# Patient Record
Sex: Female | Born: 1965 | Race: White | Hispanic: No | Marital: Married | Smoking: Never smoker
Health system: Southern US, Community
[De-identification: ages and names within clinical notes are randomized; demographics above are authoritative.]

## PROBLEM LIST (undated history)

## (undated) DIAGNOSIS — M1710 Unilateral primary osteoarthritis, unspecified knee: Secondary | ICD-10-CM

## (undated) HISTORY — DX: Unilateral primary osteoarthritis, unspecified knee: M17.10

## (undated) HISTORY — PX: JOINT REPLACEMENT: SHX530

## (undated) HISTORY — PX: WISDOM TOOTH EXTRACTION: SHX21

## (undated) HISTORY — PX: KNEE ARTHROSCOPY: SUR90

## (undated) HISTORY — PX: EYE SURGERY: SHX253

---

## 2009-06-09 LAB — HM MAMMOGRAPHY: HM Mammogram: NORMAL

## 2010-03-10 LAB — CONVERTED CEMR LAB: Pap Smear: NORMAL

## 2010-03-11 ENCOUNTER — Encounter: Payer: Self-pay | Admitting: Family Medicine

## 2010-06-02 ENCOUNTER — Ambulatory Visit: Payer: Self-pay | Admitting: Family Medicine

## 2010-06-02 DIAGNOSIS — M1710 Unilateral primary osteoarthritis, unspecified knee: Secondary | ICD-10-CM

## 2010-06-02 DIAGNOSIS — IMO0002 Reserved for concepts with insufficient information to code with codable children: Secondary | ICD-10-CM

## 2010-06-02 HISTORY — DX: Reserved for concepts with insufficient information to code with codable children: IMO0002

## 2010-06-09 ENCOUNTER — Inpatient Hospital Stay (HOSPITAL_COMMUNITY): Admission: RE | Admit: 2010-06-09 | Discharge: 2010-06-11 | Payer: Self-pay | Admitting: Orthopedic Surgery

## 2010-08-26 NOTE — Assessment & Plan Note (Signed)
Summary: BRAND NEW PT/TO EST/CJR   Vital Signs:  Patient profile:   45 year old female Menstrual status:  regular LMP:     05/18/2010 Height:      63.75 inches Weight:      214 pounds BMI:     37.16 Temp:     98.4 degrees F oral Pulse rate:   72 / minute Pulse rhythm:   regular Resp:     12 per minute BP sitting:   122 / 80  (left arm) Cuff size:   large  Vitals Entered By: Sid Falcon LPN (June 02, 2010 2:54 PM)  Nutrition Counseling: Patient's BMI is greater than 25 and therefore counseled on weight management options. LMP (date): 05/18/2010     Menstrual Status regular Enter LMP: 05/18/2010 Last PAP Result normal   History of Present Illness: Patient seen new to establish care. She has history of osteoarthritis involving both knees with planned left total knee replacement one week from today. She takes Motrin and no other medications. No chronic medical problems. No known drug allergies.  Family history significant for grandparent with type 2 diabetes. Father with osteoarthritis.  Patient is married with 2 children. She is a Comptroller. Nonsmoker. Occasional alcohol use. Enjoys cycling.  Preventive Screening-Counseling & Management  Alcohol-Tobacco     Smoking Status: never  Caffeine-Diet-Exercise     Does Patient Exercise: yes  Past History:  Family History: Last updated: 06/02/2010 Family History of Arthritis Paternal grandmother, diabetes type ll  Social History: Last updated: 06/02/2010 Occupation:  Art gallery manager Married Never Smoked Alcohol use-yes Regular exercise-yes  Risk Factors: Exercise: yes (06/02/2010)  Risk Factors: Smoking Status: never (06/02/2010)  Past Medical History: Arthritis, osteoarthrits knees Chicken pox  Past Surgical History: Left knee scope 2005 Right knee 2008 PMH-FH-SH reviewed for relevance  Family History: Family History of Arthritis Paternal grandmother, diabetes type ll  Social  History: Occupation:  Art gallery manager Married Never Smoked Alcohol use-yes Regular exercise-yes Smoking Status:  never Occupation:  employed Does Patient Exercise:  yes  Review of Systems  The patient denies anorexia, fever, weight loss, chest pain, dyspnea on exertion, peripheral edema, prolonged cough, headaches, hemoptysis, and abdominal pain.    Physical Exam  General:  Well-developed,well-nourished,in no acute distress; alert,appropriate and cooperative throughout examination Head:  Normocephalic and atraumatic without obvious abnormalities. No apparent alopecia or balding. Mouth:  Oral mucosa and oropharynx without lesions or exudates.  Teeth in good repair. Neck:  No deformities, masses, or tenderness noted. Lungs:  Normal respiratory effort, chest expands symmetrically. Lungs are clear to auscultation, no crackles or wheezes. Heart:  Normal rate and regular rhythm. S1 and S2 normal without gallop, murmur, click, rub or other extra sounds. Extremities:  No clubbing, cyanosis, edema, or deformity noted with normal full range of motion of all joints.  mild crepitus with flexion of both knees. Neurologic:  alert & oriented X3 and cranial nerves II-XII intact.   Psych:  normally interactive, good eye contact, not anxious appearing, and not depressed appearing.     Impression & Recommendations:  Problem # 1:  OSTEOARTHRITIS, KNEES, BILATERAL (ICD-715.96)  Her updated medication list for this problem includes:    Motrin Ib 200 Mg Tabs (Ibuprofen) .Marland Kitchen... Three tabs every 8 hours pen pain  Problem # 2:  Preventive Health Care (ICD-V70.0) flu vaccine and Tdap recommended as last tetanus not in several years.  Complete Medication List: 1)  Motrin Ib 200 Mg Tabs (Ibuprofen) .... Three tabs every 8 hours pen  pain  Other Orders: Admin 1st Vaccine (21308) Flu Vaccine 46yrs + (65784) Tdap => 47yrs IM (69629) Admin of Any Addtl Vaccine (52841)  Patient Instructions: 1)  Please schedule a  follow-up appointment as needed .    Orders Added: 1)  Admin 1st Vaccine [90471] 2)  Flu Vaccine 18yrs + [32440] 3)  Tdap => 59yrs IM [90715] 4)  Admin of Any Addtl Vaccine [90472] 5)  New Patient Level II [10272]   Immunizations Administered:  Tetanus Vaccine:    Vaccine Type: Tdap    Site: left deltoid    Mfr: GlaxoSmithKline    Dose: 0.5 ml    Route: IM    Given by: Sid Falcon LPN    Exp. Date: 05/15/2012    Lot #: [id number]    VIS given: 06/13/08 version given June 02, 2010.   Immunizations Administered:  Tetanus Vaccine:    Vaccine Type: Tdap    Site: left deltoid    Mfr: GlaxoSmithKline    Dose: 0.5 ml    Route: IM    Given by: Sid Falcon LPN    Exp. Date: 05/15/2012    Lot #: [id number]    VIS given: 06/13/08 version given June 02, 2010.  Preventive Care Screening  Pap Smear:    Date:  03/10/2010    Results:  normal   Mammogram:    Date:  05/27/2009    Results:  normal   Flu Vaccine Consent Questions     Do you have a history of severe allergic reactions to this vaccine? no    Any prior history of allergic reactions to egg and/or gelatin? no    Do you have a sensitivity to the preservative Thimersol? no    Do you have a past history of Guillan-Barre Syndrome? no    Do you currently have an acute febrile illness? no    Have you ever had a severe reaction to latex? no    Vaccine information given and explained to patient? yes    Are you currently pregnant? no    Lot Number:[id number]   Exp Date:01/24/2011   Site Given  Right Deltoid [email or web address]

## 2010-08-26 NOTE — Letter (Signed)
Summary: Wellness Exam Results  Wellness Exam Results   Imported By: Maryln Gottron 06/09/2010 10:37:17  _____________________________________________________________________  External Attachment:    Type:   Image     Comment:   External Document

## 2010-10-07 LAB — DIFFERENTIAL
Lymphocytes Relative: 23 % (ref 12–46)
Lymphs Abs: 1.8 10*3/uL (ref 0.7–4.0)
Neutrophils Relative %: 68 % (ref 43–77)

## 2010-10-07 LAB — CBC
HCT: 29.7 % — ABNORMAL LOW (ref 36.0–46.0)
HCT: 31.5 % — ABNORMAL LOW (ref 36.0–46.0)
Hemoglobin: 10.5 g/dL — ABNORMAL LOW (ref 12.0–15.0)
MCV: 90.9 fL (ref 78.0–100.0)
Platelets: 115 10*3/uL — ABNORMAL LOW (ref 150–400)
Platelets: 178 10*3/uL (ref 150–400)
RBC: 4.27 MIL/uL (ref 3.87–5.11)
RDW: 12.9 % (ref 11.5–15.5)
RDW: 13.1 % (ref 11.5–15.5)
WBC: 6.8 10*3/uL (ref 4.0–10.5)
WBC: 6.9 10*3/uL (ref 4.0–10.5)
WBC: 7.6 10*3/uL (ref 4.0–10.5)

## 2010-10-07 LAB — URINALYSIS, ROUTINE W REFLEX MICROSCOPIC
Protein, ur: NEGATIVE mg/dL
Urobilinogen, UA: 0.2 mg/dL (ref 0.0–1.0)

## 2010-10-07 LAB — BASIC METABOLIC PANEL
BUN: 6 mg/dL (ref 6–23)
Calcium: 8.7 mg/dL (ref 8.4–10.5)
Chloride: 107 mEq/L (ref 96–112)
Creatinine, Ser: 0.71 mg/dL (ref 0.4–1.2)
GFR calc Af Amer: >60 mL/min (ref >60)
GFR calc non Af Amer: >60 mL/min (ref >60)
Potassium: 3.8 mEq/L (ref 3.5–5.1)
Sodium: 138 mEq/L (ref 135–145)
Sodium: 138 mEq/L (ref 135–145)

## 2010-10-07 LAB — PROTIME-INR
INR: 0.94 (ref 0.00–1.49)
INR: 1.07 (ref 0.00–1.49)
INR: 1.08 (ref 0.00–1.49)
Prothrombin Time: 12.7 seconds (ref 11.6–15.2)
Prothrombin Time: 14.2 seconds (ref 11.6–15.2)

## 2010-10-07 LAB — TYPE AND SCREEN

## 2010-10-07 LAB — URINE MICROSCOPIC-ADD ON

## 2010-10-07 LAB — ABO/RH: ABO/RH(D): A NEG

## 2011-09-10 ENCOUNTER — Ambulatory Visit (INDEPENDENT_AMBULATORY_CARE_PROVIDER_SITE_OTHER): Payer: Self-pay | Admitting: Family Medicine

## 2011-09-10 ENCOUNTER — Encounter: Payer: Self-pay | Admitting: Family Medicine

## 2011-09-10 VITALS — BP 108/80 | Temp 98.6°F | Wt 244.0 lb

## 2011-09-10 DIAGNOSIS — B078 Other viral warts: Secondary | ICD-10-CM

## 2011-09-10 DIAGNOSIS — Z7189 Other specified counseling: Secondary | ICD-10-CM

## 2011-09-10 DIAGNOSIS — Z7184 Encounter for health counseling related to travel: Secondary | ICD-10-CM

## 2011-09-10 MED ORDER — ONDANSETRON HCL 8 MG PO TABS: 8.0000 mg | ORAL_TABLET | Freq: Three times a day (TID) | ORAL | Status: AC | PRN

## 2011-09-10 MED ORDER — ZOLPIDEM TARTRATE 10 MG PO TABS: 10.0000 mg | ORAL_TABLET | Freq: Every evening | ORAL | Status: DC | PRN

## 2011-09-10 MED ORDER — CIPROFLOXACIN HCL 500 MG PO TABS: 500.0000 mg | ORAL_TABLET | Freq: Two times a day (BID) | ORAL | Status: AC

## 2011-09-10 NOTE — Progress Notes (Signed)
  Subjective:    Patient ID: Monica Zuniga, female    DOB: 1966/07/25, 46 y.o.   MRN: [id number]  HPI  Patient seen for the following issues  Common wart left middle finger. She tried home treatment with liquid nitrogen without much success. Requesting retreatment today. Occasionally irritating because of location.  Frequent international travel. Immunizations are up-to-date. Requesting prescription for Cipro for traveler's diarrhea. Also, she is here to discuss possible sedative to help with the jet lag and sleep on the plane. She's taken Benadryl but has hang- over drowsiness.  Past Medical History  Diagnosis Date  . OSTEOARTHRITIS, KNEES, BILATERAL 06/02/2010   Past Surgical History  Procedure Date  . Knee arthroscopy     left knee scope 2005, right 2008    reports that she has never smoked. She does not have any smokeless tobacco history on file. Her alcohol and drug histories not on file. family history includes Arthritis in her other and Diabetes in her paternal grandmother. No Known Allergies    Review of Systems  Constitutional: Negative for fever and chills.  Respiratory: Negative for cough.   Cardiovascular: Negative for chest pain.  Gastrointestinal: Negative for nausea and vomiting.       Objective:   Physical Exam  Constitutional: She appears well-developed and well-nourished.  Cardiovascular: Normal rate and regular rhythm.   Pulmonary/Chest: Effort normal and breath sounds normal. No respiratory distress. She has no wheezes. She has no rales.  Skin:       Left middle finger reveals small common wart which is approximately 2 mm diameter which is between the distal interphalangeal joint and tip of the finger          Assessment & Plan:  #1 common wart left middle finger. Discussed risk and benefits of treatment liquid nitrogen patient consents. Treated without difficulty. Followup in 2-3 weeks if not resolving #2 travel counseling. Discussed measures  to reduce jet lag. Discussed possible use of melatonin.  Ambien 10 mg each bedtime when necessary.   Cipro for as needed use for traveler's diarrhea. Also wrote for Zofran 8 mg tablets one every 8 hours as needed for any nausea or vomiting.  She needs to check and see if typhoid vaccine is up to date

## 2011-09-17 IMAGING — CR DG CHEST 2V
2 series · 2 of 2 positions shown · non-contrast
Comparison: None available.

CLINICAL DATA: Preadmission respiratory film for patient with
degenerative disease of the left knee.

CHEST - 2 VIEW

[view not recorded (1 of 2)]
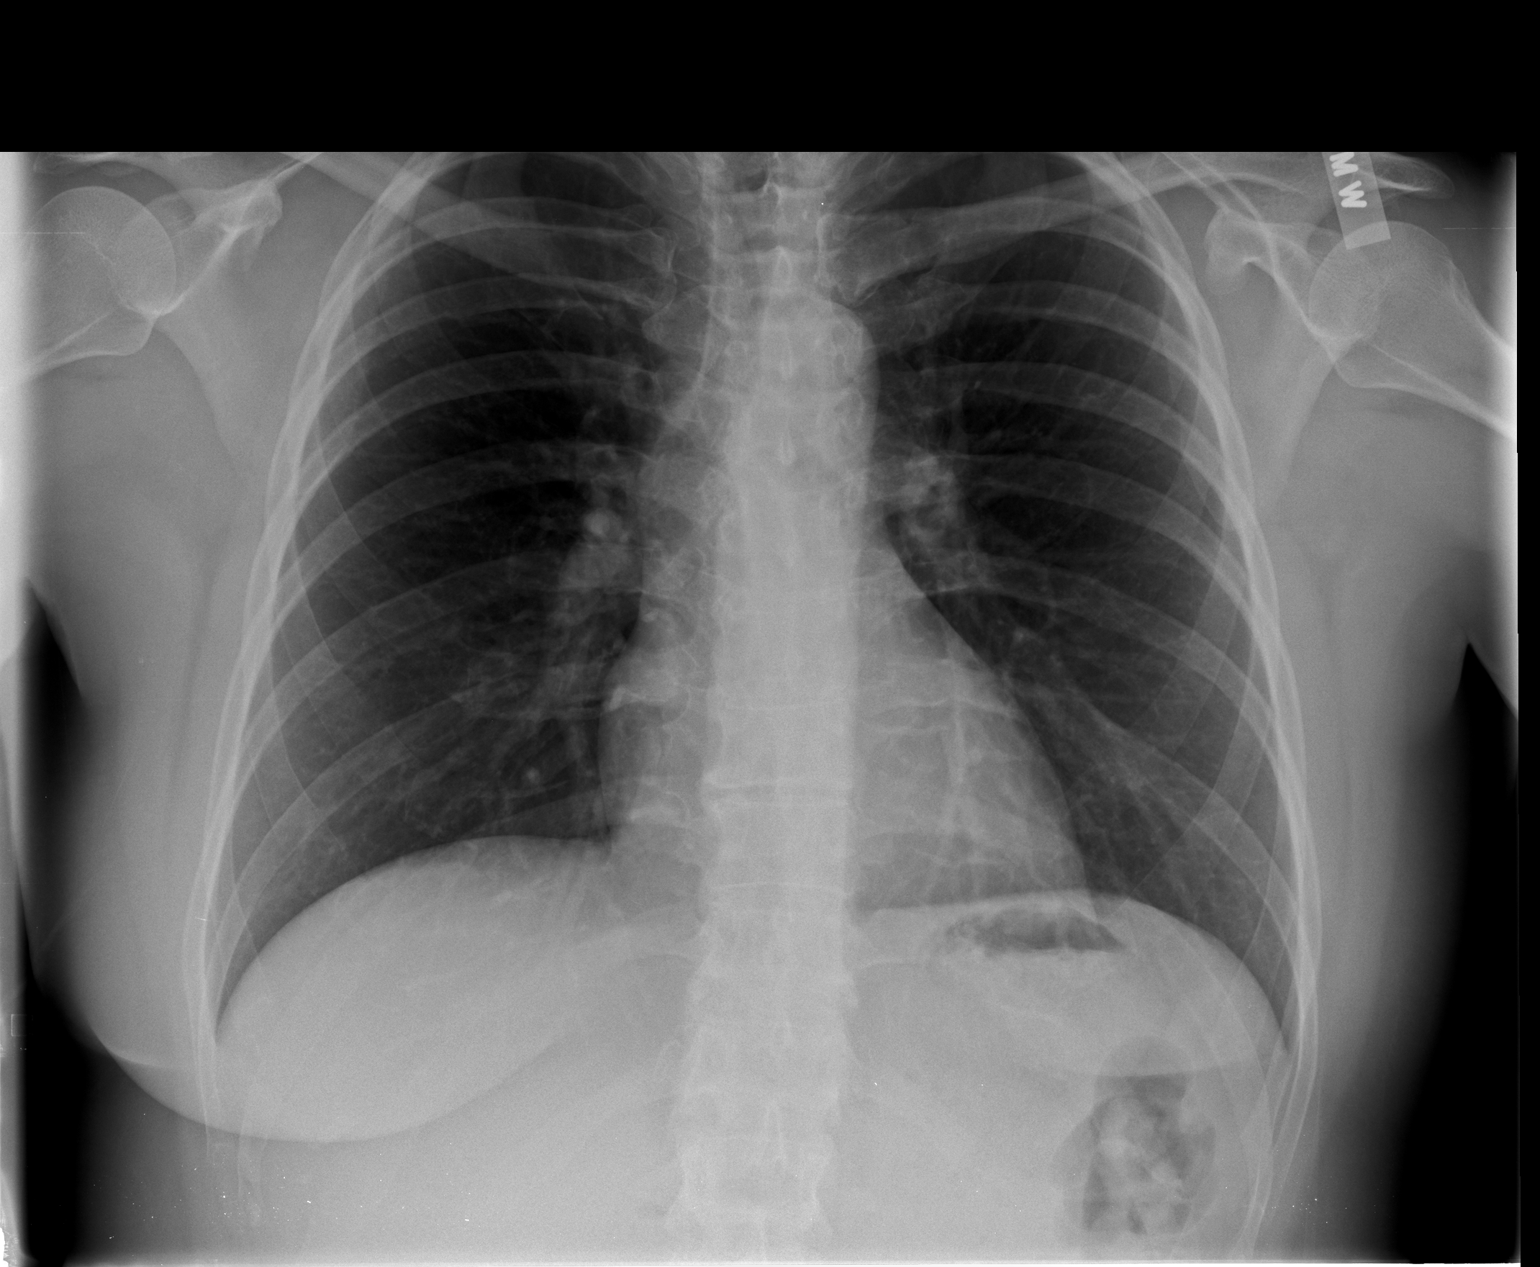

[view not recorded (2 of 2)]
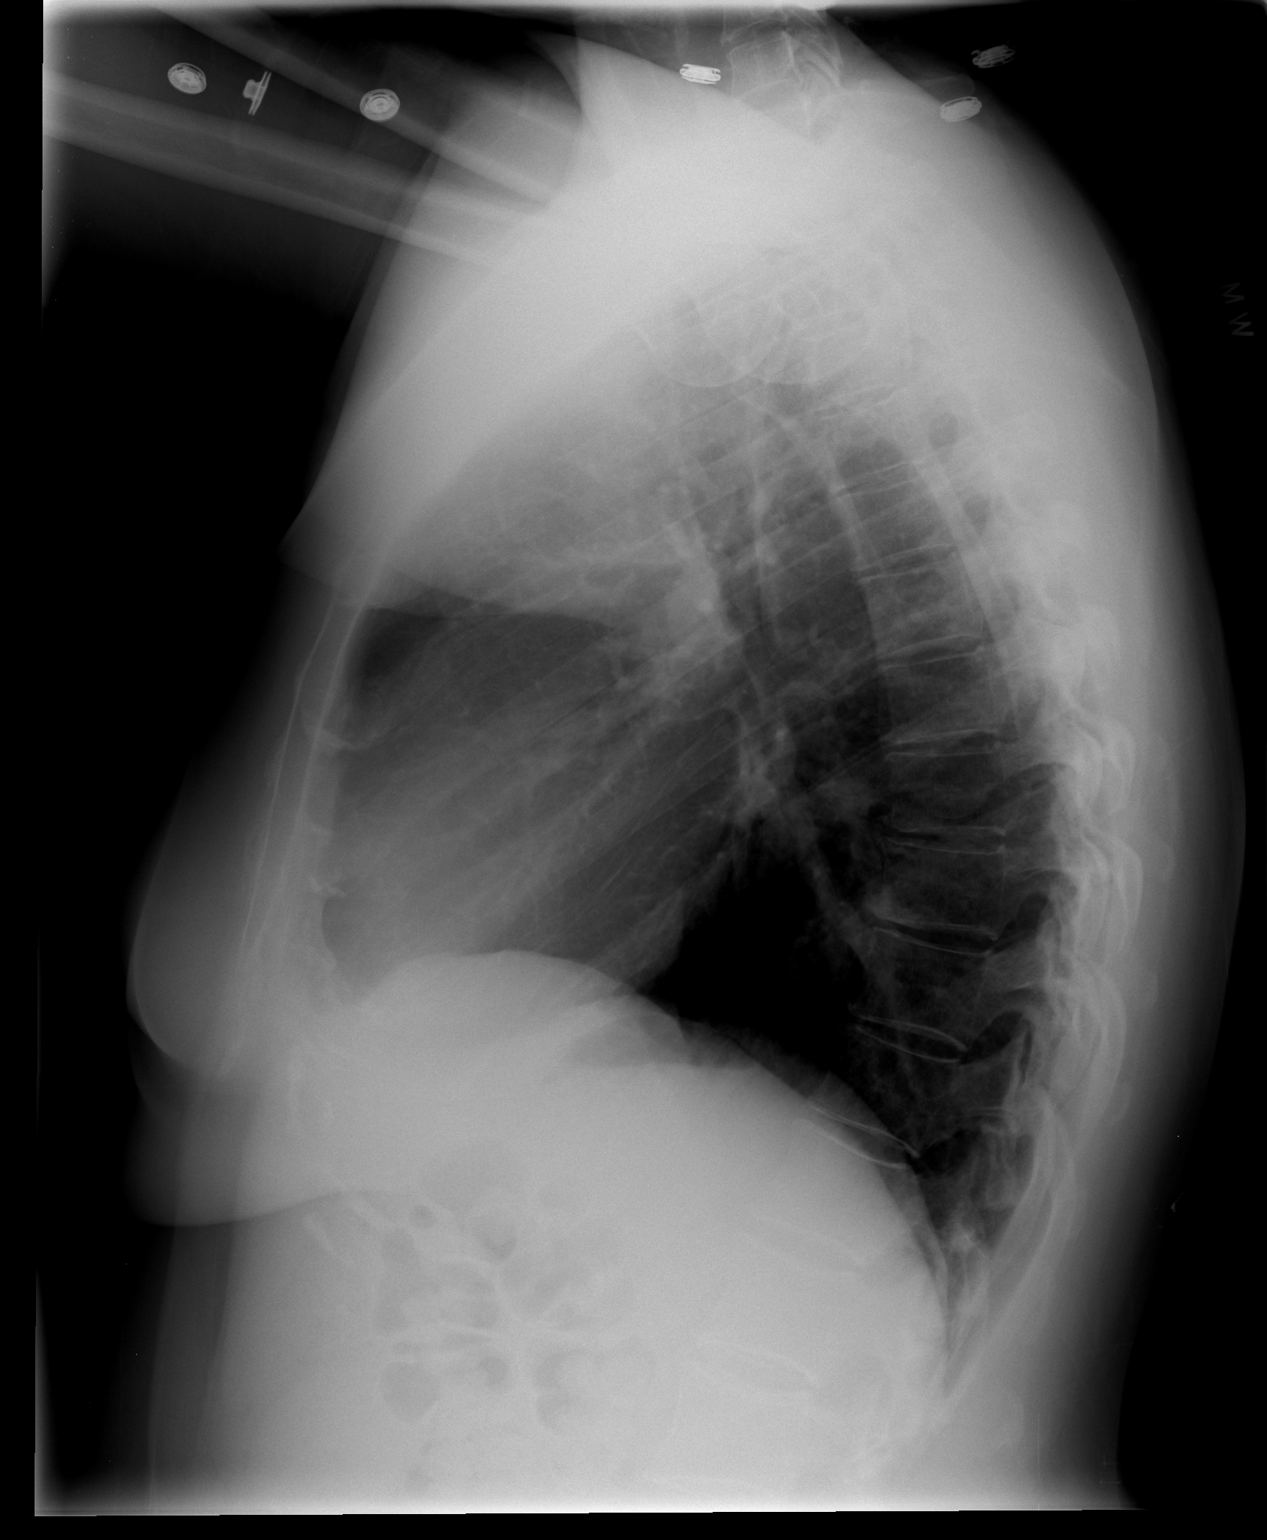

[2 of 2 positions shown; findings below may reference images not displayed]

FINDINGS: Lungs are clear.  No pneumothorax or pleural effusion.
Heart size normal.
IMPRESSION: Negative chest.

## 2012-06-10 ENCOUNTER — Other Ambulatory Visit: Payer: Self-pay | Admitting: Orthopedic Surgery

## 2012-06-13 ENCOUNTER — Encounter (HOSPITAL_COMMUNITY): Payer: Self-pay | Admitting: Pharmacy Technician

## 2012-06-20 ENCOUNTER — Encounter (HOSPITAL_COMMUNITY): Payer: Self-pay

## 2012-06-20 ENCOUNTER — Encounter (HOSPITAL_COMMUNITY)
Admission: RE | Admit: 2012-06-20 | Discharge: 2012-06-20 | Disposition: A | Discharge status: Home / Self Care | Payer: BC Managed Care – HMO | Source: Ambulatory Visit | Attending: Orthopedic Surgery | Admitting: Orthopedic Surgery

## 2012-06-20 LAB — URINALYSIS, ROUTINE W REFLEX MICROSCOPIC
Bilirubin Urine: NEGATIVE
Glucose, UA: NEGATIVE mg/dL
Hgb urine dipstick: NEGATIVE
Ketones, ur: NEGATIVE mg/dL
Protein, ur: NEGATIVE mg/dL
Urobilinogen, UA: 0.2 mg/dL (ref 0.0–1.0)

## 2012-06-20 LAB — BASIC METABOLIC PANEL
BUN: 15 mg/dL (ref 6–23)
CO2: 28 mEq/L (ref 19–32)
Calcium: 10.1 mg/dL (ref 8.4–10.5)
Chloride: 100 mEq/L (ref 96–112)
Creatinine, Ser: 0.66 mg/dL (ref 0.50–1.10)

## 2012-06-20 LAB — CBC WITH DIFFERENTIAL/PLATELET
Basophils Absolute: 0 10*3/uL (ref 0.0–0.1)
Basophils Relative: 0 % (ref 0–1)
Eosinophils Absolute: 0.1 10*3/uL (ref 0.0–0.7)
Eosinophils Relative: 1 % (ref 0–5)
HCT: 41.5 % (ref 36.0–46.0)
Lymphocytes Relative: 31 % (ref 12–46)
MCH: 30 pg (ref 26.0–34.0)
MCHC: 34 g/dL (ref 30.0–36.0)
MCV: 88.3 fL (ref 78.0–100.0)
Monocytes Absolute: 0.7 10*3/uL (ref 0.1–1.0)
Platelets: 232 10*3/uL (ref 150–400)
RDW: 13.3 % (ref 11.5–15.5)
WBC: 9.5 10*3/uL (ref 4.0–10.5)

## 2012-06-20 LAB — URINE MICROSCOPIC-ADD ON

## 2012-06-20 LAB — PROTIME-INR: Prothrombin Time: 12.3 seconds (ref 11.6–15.2)

## 2012-06-20 LAB — SURGICAL PCR SCREEN: MRSA, PCR: NEGATIVE

## 2012-06-20 LAB — APTT: aPTT: 22 seconds — ABNORMAL LOW (ref 24–37)

## 2012-06-20 NOTE — Pre-Procedure Instructions (Signed)
20 Monica Zuniga  06/20/2012   Your procedure is scheduled on:  Monday, December 2nd  Report to [facility] at 0745 AM.  Call this number if you have problems the morning of surgery: [phone]   Remember:   Do not eat food or drink:After Midnight.   Take these medicines the morning of surgery with A SIP OF WATER: tylenol if needed   Do not wear jewelry, make-up or nail polish.  Do not wear lotions, powders, or perfumes. You may wear deodorant.  Do not shave 48 hours prior to surgery. Men may shave face and neck.  Do not bring valuables to the hospital.  Contacts, dentures or bridgework may not be worn into surgery.  Leave suitcase in the car. After surgery it may be brought to your room.  For patients admitted to the hospital, checkout time is 11:00 AM the day of discharge.   Patients discharged the day of surgery will not be allowed to drive home.   Special Instructions: Shower using CHG 2 nights before surgery and the night before surgery.  If you shower the day of surgery use CHG.  Use special wash - you have one bottle of CHG for all showers.  You should use approximately 1/3 of the bottle for each shower.   Please read over the following fact sheets that you were given: Pain Booklet, Coughing and Deep Breathing, Blood Transfusion Information, MRSA Information and Surgical Site Infection Prevention

## 2012-06-21 NOTE — H&P (Signed)
TOTAL KNEE ADMISSION H&P  Patient is being admitted for right total knee arthroplasty.  Subjective:  Chief Complaint:right knee pain.  HPI: Monica Zuniga, 46 y.o. female, has a history of pain and functional disability in the right knee due to arthritis and has failed non-surgical conservative treatments for greater than 12 weeks to includeNSAID's and/or analgesics, corticosteriod injections, viscosupplementation injections and activity modification.  Onset of symptoms was gradual, starting 5 years ago with gradually worsening course since that time. The patient noted no past surgery on the right knee(s).  Patient currently rates pain in the right knee(s) at 7 out of 10 with activity. Patient has night pain, worsening of pain with activity and weight bearing and pain that interferes with activities of daily living.  Patient has evidence of subchondral cysts, periarticular osteophytes and joint space narrowing by imaging studies. There is no active infection.  Patient Active Problem List   Diagnosis Date Noted  . OSTEOARTHRITIS, KNEES, BILATERAL 06/02/2010   Past Medical History  Diagnosis Date  . OSTEOARTHRITIS, KNEES, BILATERAL 06/02/2010    Past Surgical History  Procedure Date  . Knee arthroscopy     left knee scope 2005, right 2008  . Joint replacement     L- replacement- 2011  . Wisdom tooth extraction     /w sedation  . Eye surgery     as a baby- surgery for eye problem     No prescriptions prior to admission   Allergies  Allergen Reactions  . Shellfish Allergy Other (See Comments)    Numbness     History  Substance Use Topics  . Smoking status: Never Smoker   . Smokeless tobacco: Not on file  . Alcohol Use: Yes     Comment: on weekends only    Family History  Problem Relation Age of Onset  . Diabetes Paternal Grandmother     type ll  . Arthritis Other      Review of Systems  Constitutional: Negative.   HENT: Negative.   Eyes: Negative.   Respiratory:  Negative.   Cardiovascular: Negative.   Gastrointestinal: Negative.   Genitourinary: Negative.   Musculoskeletal: Positive for joint pain.  Skin: Negative.   Neurological: Negative.   Endo/Heme/Allergies: Negative.   Psychiatric/Behavioral: Negative.     Objective:  Physical Exam  Constitutional: She is oriented to person, place, and time. She appears well-developed and well-nourished.  HENT:  Head: Normocephalic.  Eyes: Pupils are equal, round, and reactive to light.  Cardiovascular: Normal heart sounds.   Respiratory: Breath sounds normal.  Musculoskeletal:       Right knee: She exhibits decreased range of motion, effusion and deformity. tenderness found. Medial joint line tenderness noted.  Neurological: She is alert and oriented to person, place, and time.  Skin: Skin is warm and dry.  Psychiatric: She has a normal mood and affect.    Vital signs in last 24 hours:    Labs:   Estimated Body mass index is 42.21 kg/(m^2) as calculated from the following:   Height as of 06/02/10: 5' 3.75"(1.619 m).   Weight as of 09/10/11: 244 lb(110.678 kg).   Imaging Review Plain radiographs demonstrate severe degenerative joint disease of the right knee(s). The overall alignment ismild varus. The bone quality appears to be adequate for age and reported activity level.  Assessment/Plan:  End stage arthritis, right knee   The patient history, physical examination, clinical judgment of the provider and imaging studies are consistent with end stage degenerative joint disease of  the right knee(s) and total knee arthroplasty is deemed medically necessary. The treatment options including medical management, injection therapy arthroscopy and arthroplasty were discussed at length. The risks and benefits of total knee arthroplasty were presented and reviewed. The risks due to aseptic loosening, infection, stiffness, patella tracking problems, thromboembolic complications and other imponderables  were discussed. The patient acknowledged the explanation, agreed to proceed with the plan and consent was signed. Patient is being admitted for inpatient treatment for surgery, pain control, PT, OT, prophylactic antibiotics, VTE prophylaxis, progressive ambulation and ADL's and discharge planning. The patient is planning to be discharged home with home health services

## 2012-06-24 NOTE — Progress Notes (Signed)
I left a message on patients voice mail on cell phone with time of arrival change.  Pt instructed to arrive at 0530.

## 2012-06-26 HISTORY — PX: TOTAL KNEE ARTHROPLASTY: SHX125

## 2012-06-26 MED ORDER — CEFAZOLIN SODIUM-DEXTROSE 2-3 GM-% IV SOLR
2.0000 g | INTRAVENOUS | Status: AC
  Administered 2012-06-27: 2 g via INTRAVENOUS
  Filled 2012-06-26: qty 50

## 2012-06-27 ENCOUNTER — Inpatient Hospital Stay (HOSPITAL_COMMUNITY): Payer: BC Managed Care – HMO | Admitting: Anesthesiology

## 2012-06-27 ENCOUNTER — Inpatient Hospital Stay (HOSPITAL_COMMUNITY)
Admission: RE | Admit: 2012-06-27 | Discharge: 2012-06-28 | DRG: 209 | Disposition: A | Discharge status: Home Health | Payer: BC Managed Care – HMO | Source: Ambulatory Visit | Attending: Orthopedic Surgery | Admitting: Orthopedic Surgery

## 2012-06-27 ENCOUNTER — Encounter (HOSPITAL_COMMUNITY)
Admission: RE | Disposition: A | Discharge status: Home Health | Payer: Self-pay | Source: Ambulatory Visit | Attending: Orthopedic Surgery

## 2012-06-27 ENCOUNTER — Encounter (HOSPITAL_COMMUNITY): Payer: Self-pay | Admitting: Anesthesiology

## 2012-06-27 DIAGNOSIS — Z6841 Body Mass Index (BMI) 40.0 and over, adult: Secondary | ICD-10-CM

## 2012-06-27 DIAGNOSIS — M1710 Unilateral primary osteoarthritis, unspecified knee: Principal | ICD-10-CM | POA: Diagnosis present

## 2012-06-27 DIAGNOSIS — IMO0002 Reserved for concepts with insufficient information to code with codable children: Principal | ICD-10-CM | POA: Diagnosis present

## 2012-06-27 DIAGNOSIS — M25469 Effusion, unspecified knee: Secondary | ICD-10-CM | POA: Diagnosis present

## 2012-06-27 DIAGNOSIS — Z791 Long term (current) use of non-steroidal anti-inflammatories (NSAID): Secondary | ICD-10-CM

## 2012-06-27 DIAGNOSIS — Z23 Encounter for immunization: Secondary | ICD-10-CM

## 2012-06-27 HISTORY — PX: TOTAL KNEE ARTHROPLASTY: SHX125

## 2012-06-27 LAB — TYPE AND SCREEN: Antibody Screen: NEGATIVE

## 2012-06-27 SURGERY — ARTHROPLASTY, KNEE, TOTAL
Anesthesia: General | Site: Knee | Laterality: Right | Wound class: Clean

## 2012-06-27 MED ORDER — INFLUENZA VIRUS VACC SPLIT PF IM SUSP
0.5000 mL | INTRAMUSCULAR | Status: AC
  Administered 2012-06-28: 0.5 mL via INTRAMUSCULAR
  Filled 2012-06-27 (×3): qty 0.5

## 2012-06-27 MED ORDER — OXYCODONE HCL 5 MG PO TABS
5.0000 mg | ORAL_TABLET | ORAL | Status: DC | PRN
  Administered 2012-06-28 (×3): 10 mg via ORAL
  Filled 2012-06-27 (×3): qty 2

## 2012-06-27 MED ORDER — ONDANSETRON HCL 4 MG PO TABS: 4.0000 mg | ORAL_TABLET | Freq: Four times a day (QID) | ORAL | Status: DC | PRN

## 2012-06-27 MED ORDER — OXYCODONE HCL 5 MG/5ML PO SOLN: 5.0000 mg | Freq: Once | ORAL | Status: DC | PRN

## 2012-06-27 MED ORDER — METOCLOPRAMIDE HCL 10 MG PO TABS: 5.0000 mg | ORAL_TABLET | Freq: Three times a day (TID) | ORAL | Status: DC | PRN

## 2012-06-27 MED ORDER — DIPHENHYDRAMINE HCL 12.5 MG/5ML PO ELIX: 12.5000 mg | ORAL_SOLUTION | ORAL | Status: DC | PRN

## 2012-06-27 MED ORDER — FLEET ENEMA 7-19 GM/118ML RE ENEM: 1.0000 | ENEMA | Freq: Once | RECTAL | Status: AC | PRN

## 2012-06-27 MED ORDER — DEXAMETHASONE SODIUM PHOSPHATE 4 MG/ML IJ SOLN
INTRAMUSCULAR | Status: DC | PRN
  Administered 2012-06-27: 10 mg

## 2012-06-27 MED ORDER — LACTATED RINGERS IV SOLN
INTRAVENOUS | Status: DC | PRN
  Administered 2012-06-27 (×2): via INTRAVENOUS

## 2012-06-27 MED ORDER — MIDAZOLAM HCL 5 MG/5ML IJ SOLN
INTRAMUSCULAR | Status: DC | PRN
  Administered 2012-06-27: 2 mg via INTRAVENOUS

## 2012-06-27 MED ORDER — HYDROMORPHONE HCL PF 1 MG/ML IJ SOLN
INTRAMUSCULAR | Status: AC
  Filled 2012-06-27: qty 1

## 2012-06-27 MED ORDER — SODIUM CHLORIDE 0.9 % IR SOLN
Status: DC | PRN
  Administered 2012-06-27: 3000 mL

## 2012-06-27 MED ORDER — ACETAMINOPHEN 325 MG PO TABS: 650.0000 mg | ORAL_TABLET | Freq: Four times a day (QID) | ORAL | Status: DC | PRN

## 2012-06-27 MED ORDER — HYDROMORPHONE HCL PF 1 MG/ML IJ SOLN
0.2500 mg | INTRAMUSCULAR | Status: DC | PRN
  Administered 2012-06-27 (×3): 0.5 mg via INTRAVENOUS

## 2012-06-27 MED ORDER — MAGNESIUM HYDROXIDE 400 MG/5ML PO SUSP: 30.0000 mL | Freq: Every day | ORAL | Status: DC | PRN

## 2012-06-27 MED ORDER — BISACODYL 5 MG PO TBEC: 5.0000 mg | DELAYED_RELEASE_TABLET | Freq: Every day | ORAL | Status: DC | PRN

## 2012-06-27 MED ORDER — PROPOFOL 10 MG/ML IV BOLUS
INTRAVENOUS | Status: DC | PRN
  Administered 2012-06-27: 50 mg via INTRAVENOUS
  Administered 2012-06-27: 200 mg via INTRAVENOUS

## 2012-06-27 MED ORDER — CELECOXIB 200 MG PO CAPS
200.0000 mg | ORAL_CAPSULE | Freq: Two times a day (BID) | ORAL | Status: DC
  Administered 2012-06-27 – 2012-06-28 (×2): 200 mg via ORAL
  Filled 2012-06-27 (×3): qty 1

## 2012-06-27 MED ORDER — PROMETHAZINE HCL 25 MG/ML IJ SOLN: 6.2500 mg | INTRAMUSCULAR | Status: DC | PRN

## 2012-06-27 MED ORDER — BUPIVACAINE-EPINEPHRINE PF 0.5-1:200000 % IJ SOLN
INTRAMUSCULAR | Status: DC | PRN
  Administered 2012-06-27: 150 mg

## 2012-06-27 MED ORDER — CEFUROXIME SODIUM 1.5 G IJ SOLR
INTRAMUSCULAR | Status: AC
  Filled 2012-06-27: qty 1.5

## 2012-06-27 MED ORDER — PHENOL 1.4 % MT LIQD: 1.0000 | OROMUCOSAL | Status: DC | PRN

## 2012-06-27 MED ORDER — ACETAMINOPHEN 650 MG RE SUPP: 650.0000 mg | Freq: Four times a day (QID) | RECTAL | Status: DC | PRN

## 2012-06-27 MED ORDER — METHOCARBAMOL 500 MG PO TABS
500.0000 mg | ORAL_TABLET | Freq: Four times a day (QID) | ORAL | Status: DC | PRN
  Administered 2012-06-28: 500 mg via ORAL
  Filled 2012-06-27: qty 1

## 2012-06-27 MED ORDER — DEXTROSE-NACL 5-0.45 % IV SOLN: INTRAVENOUS | Status: DC

## 2012-06-27 MED ORDER — HYDROMORPHONE HCL PF 1 MG/ML IJ SOLN
INTRAMUSCULAR | Status: AC
Start: 2012-06-27 — End: 2012-06-27
  Filled 2012-06-27: qty 1

## 2012-06-27 MED ORDER — OXYCODONE HCL 5 MG PO TABS: 5.0000 mg | ORAL_TABLET | Freq: Once | ORAL | Status: DC | PRN

## 2012-06-27 MED ORDER — METHOCARBAMOL 100 MG/ML IJ SOLN
500.0000 mg | INTRAVENOUS | Status: AC
  Administered 2012-06-27: 500 mg via INTRAVENOUS
  Filled 2012-06-27: qty 5

## 2012-06-27 MED ORDER — FENTANYL CITRATE 0.05 MG/ML IJ SOLN
INTRAMUSCULAR | Status: DC | PRN
  Administered 2012-06-27 (×5): 50 ug via INTRAVENOUS

## 2012-06-27 MED ORDER — METHOCARBAMOL 100 MG/ML IJ SOLN
500.0000 mg | Freq: Four times a day (QID) | INTRAVENOUS | Status: DC | PRN
  Filled 2012-06-27: qty 5

## 2012-06-27 MED ORDER — ZOLPIDEM TARTRATE 5 MG PO TABS: 5.0000 mg | ORAL_TABLET | Freq: Every evening | ORAL | Status: DC | PRN

## 2012-06-27 MED ORDER — CHLORHEXIDINE GLUCONATE 4 % EX LIQD: 60.0000 mL | Freq: Once | CUTANEOUS | Status: DC

## 2012-06-27 MED ORDER — HYDROMORPHONE HCL PF 1 MG/ML IJ SOLN
1.0000 mg | INTRAMUSCULAR | Status: DC | PRN
  Administered 2012-06-27 – 2012-06-28 (×5): 1 mg via INTRAVENOUS
  Filled 2012-06-27 (×5): qty 1

## 2012-06-27 MED ORDER — KCL IN DEXTROSE-NACL 20-5-0.45 MEQ/L-%-% IV SOLN
INTRAVENOUS | Status: DC
  Administered 2012-06-27 (×2): via INTRAVENOUS
  Filled 2012-06-27 (×5): qty 1000

## 2012-06-27 MED ORDER — MENTHOL 3 MG MT LOZG: 1.0000 | LOZENGE | OROMUCOSAL | Status: DC | PRN

## 2012-06-27 MED ORDER — ONDANSETRON HCL 4 MG/2ML IJ SOLN: 4.0000 mg | Freq: Four times a day (QID) | INTRAMUSCULAR | Status: DC | PRN

## 2012-06-27 MED ORDER — ONDANSETRON HCL 4 MG/2ML IJ SOLN
INTRAMUSCULAR | Status: DC | PRN
  Administered 2012-06-27: 4 mg via INTRAVENOUS

## 2012-06-27 MED ORDER — CEFUROXIME SODIUM 1.5 G IJ SOLR
INTRAMUSCULAR | Status: DC | PRN
  Administered 2012-06-27: 1.5 g

## 2012-06-27 MED ORDER — ASPIRIN EC 325 MG PO TBEC
325.0000 mg | DELAYED_RELEASE_TABLET | Freq: Two times a day (BID) | ORAL | Status: DC
  Administered 2012-06-27 – 2012-06-28 (×2): 325 mg via ORAL
  Filled 2012-06-27 (×3): qty 1

## 2012-06-27 MED ORDER — METOCLOPRAMIDE HCL 5 MG/ML IJ SOLN: 5.0000 mg | Freq: Three times a day (TID) | INTRAMUSCULAR | Status: DC | PRN

## 2012-06-27 MED ORDER — ALUM & MAG HYDROXIDE-SIMETH 200-200-20 MG/5ML PO SUSP: 30.0000 mL | ORAL | Status: DC | PRN

## 2012-06-27 SURGICAL SUPPLY — 51 items
BANDAGE ESMARK 6X9 LF (GAUZE/BANDAGES/DRESSINGS) ×1 IMPLANT
BLADE SAG 18X100X1.27 (BLADE) ×2 IMPLANT
BLADE SAW SGTL 13X75X1.27 (BLADE) ×2 IMPLANT
BLADE SURG ROTATE 9660 (MISCELLANEOUS) IMPLANT
BNDG ELASTIC 6X10 VLCR STRL LF (GAUZE/BANDAGES/DRESSINGS) ×2 IMPLANT
BNDG ESMARK 6X9 LF (GAUZE/BANDAGES/DRESSINGS) ×2
BOWL SMART MIX CTS (DISPOSABLE) ×2 IMPLANT
CEMENT HV SMART SET (Cement) ×4 IMPLANT
CLOTH BEACON ORANGE TIMEOUT ST (SAFETY) ×2 IMPLANT
COVER BACK TABLE 24X17X13 BIG (DRAPES) IMPLANT
COVER SURGICAL LIGHT HANDLE (MISCELLANEOUS) ×2 IMPLANT
CUFF TOURNIQUET SINGLE 34IN LL (TOURNIQUET CUFF) ×2 IMPLANT
CUFF TOURNIQUET SINGLE 44IN (TOURNIQUET CUFF) IMPLANT
DRAPE EXTREMITY T 121X128X90 (DRAPE) ×2 IMPLANT
DRAPE U-SHAPE 47X51 STRL (DRAPES) ×2 IMPLANT
DURAPREP 26ML APPLICATOR (WOUND CARE) ×2 IMPLANT
ELECT REM PT RETURN 9FT ADLT (ELECTROSURGICAL) ×2
ELECTRODE REM PT RTRN 9FT ADLT (ELECTROSURGICAL) ×1 IMPLANT
EVACUATOR 1/8 PVC DRAIN (DRAIN) ×2 IMPLANT
GAUZE XEROFORM 1X8 LF (GAUZE/BANDAGES/DRESSINGS) ×2 IMPLANT
GLOVE BIO SURGEON STRL SZ7 (GLOVE) ×2 IMPLANT
GLOVE BIO SURGEON STRL SZ7.5 (GLOVE) ×2 IMPLANT
GLOVE BIOGEL PI IND STRL 7.0 (GLOVE) ×1 IMPLANT
GLOVE BIOGEL PI IND STRL 8 (GLOVE) ×1 IMPLANT
GLOVE BIOGEL PI INDICATOR 7.0 (GLOVE) ×1
GLOVE BIOGEL PI INDICATOR 8 (GLOVE) ×1
GOWN PREVENTION PLUS XLARGE (GOWN DISPOSABLE) ×2 IMPLANT
GOWN STRL NON-REIN LRG LVL3 (GOWN DISPOSABLE) ×4 IMPLANT
HANDPIECE INTERPULSE COAX TIP (DISPOSABLE) ×1
HOOD PEEL AWAY FACE SHEILD DIS (HOOD) ×4 IMPLANT
KIT BASIN OR (CUSTOM PROCEDURE TRAY) ×2 IMPLANT
KIT ROOM TURNOVER OR (KITS) ×2 IMPLANT
MANIFOLD NEPTUNE II (INSTRUMENTS) ×2 IMPLANT
NS IRRIG 1000ML POUR BTL (IV SOLUTION) ×2 IMPLANT
PACK TOTAL JOINT (CUSTOM PROCEDURE TRAY) ×2 IMPLANT
PAD ARMBOARD 7.5X6 YLW CONV (MISCELLANEOUS) ×4 IMPLANT
PADDING CAST COTTON 6X4 STRL (CAST SUPPLIES) ×2 IMPLANT
SET HNDPC FAN SPRY TIP SCT (DISPOSABLE) ×1 IMPLANT
SPONGE GAUZE 4X4 12PLY (GAUZE/BANDAGES/DRESSINGS) ×4 IMPLANT
STAPLER VISISTAT 35W (STAPLE) ×2 IMPLANT
SUCTION FRAZIER TIP 10 FR DISP (SUCTIONS) ×2 IMPLANT
SUT VIC AB 0 CTX 36 (SUTURE) ×1
SUT VIC AB 0 CTX36XBRD ANTBCTR (SUTURE) ×1 IMPLANT
SUT VIC AB 1 CTX 36 (SUTURE) ×1
SUT VIC AB 1 CTX36XBRD ANBCTR (SUTURE) ×1 IMPLANT
SUT VIC AB 2-0 CT1 27 (SUTURE) ×1
SUT VIC AB 2-0 CT1 TAPERPNT 27 (SUTURE) ×1 IMPLANT
TOWEL OR 17X24 6PK STRL BLUE (TOWEL DISPOSABLE) ×2 IMPLANT
TOWEL OR 17X26 10 PK STRL BLUE (TOWEL DISPOSABLE) ×2 IMPLANT
TRAY FOLEY CATH 14FR (SET/KITS/TRAYS/PACK) ×2 IMPLANT
WATER STERILE IRR 1000ML POUR (IV SOLUTION) ×4 IMPLANT

## 2012-06-27 NOTE — Plan of Care (Signed)
Problem: Consults Goal: Diagnosis- Total Joint Replacement Primary Total Knee Right     

## 2012-06-27 NOTE — Evaluation (Signed)
Physical Therapy Evaluation Patient Details Name: Monica Zuniga MRN: [id number] DOB: 08-21-1965 Today's Date: 06/27/2012 Time: 4098-1191 PT Time Calculation (min): 22 min  PT Assessment / Plan / Recommendation Clinical Impression  Patient is a 46 yo female s/p Rt. TKA.  Patient will benefit from acute PT to maximize independence prior to return home with husband.    PT Assessment  Patient needs continued PT services    Follow Up Recommendations  Home health PT;Supervision/Assistance - 24 hour    Does the patient have the potential to tolerate intense rehabilitation      Barriers to Discharge None      Equipment Recommendations  None recommended by OT    Recommendations for Other Services     Frequency 7X/week    Precautions / Restrictions Precautions Precautions: Knee Precaution Booklet Issued: Yes (comment) Precaution Comments: Reviewed precautions with patient and husband. Restrictions Weight Bearing Restrictions: Yes RLE Weight Bearing: Weight bearing as tolerated   Pertinent Vitals/Pain       Mobility  Bed Mobility Bed Mobility: Not assessed Supine to Sit: 4: Min assist;HOB flat Sitting - Scoot to Edge of Bed: 4: Min assist;With rail Details for Bed Mobility Assistance: Verbal cues for technique.  Assist to move RLE off of bed. Transfers Transfers: Sit to Stand;Stand to Sit Sit to Stand: 4: Min guard;With upper extremity assist;From chair/3-in-1 Stand to Sit: 4: Min guard;With upper extremity assist;To chair/3-in-1 Stand Pivot Transfers: 4: Min assist Details for Transfer Assistance: pt with good recall from previous surgery Ambulation/Gait Ambulation/Gait Assistance: Not tested (comment)       Exercises Total Joint Exercises Ankle Circles/Pumps: AROM;Both;10 reps;Seated   PT Diagnosis: Difficulty walking;Acute pain;Generalized weakness  PT Problem List: Decreased strength;Decreased range of motion;Decreased activity tolerance;Decreased  balance;Decreased mobility;Decreased knowledge of use of DME;Decreased knowledge of precautions;Pain PT Treatment Interventions: DME instruction;Gait training;Stair training;Functional mobility training;Therapeutic exercise;Patient/family education   PT Goals Acute Rehab PT Goals PT Goal Formulation: With patient/family Time For Goal Achievement: 07/04/12 Potential to Achieve Goals: Good Pt will go Supine/Side to Sit: with supervision;with HOB 0 degrees PT Goal: Supine/Side to Sit - Progress: Goal set today Pt will go Sit to Supine/Side: with supervision;with HOB 0 degrees PT Goal: Sit to Supine/Side - Progress: Goal set today Pt will go Sit to Stand: with supervision;with upper extremity assist PT Goal: Sit to Stand - Progress: Goal set today Pt will go Stand to Sit: with supervision;with upper extremity assist PT Goal: Stand to Sit - Progress: Goal set today Pt will Ambulate: >150 feet;with supervision;with rolling walker PT Goal: Ambulate - Progress: Goal set today Pt will Go Up / Down Stairs: 1-2 stairs;with supervision;with rolling walker PT Goal: Up/Down Stairs - Progress: Goal set today Pt will Perform Home Exercise Program: Independently PT Goal: Perform Home Exercise Program - Progress: Goal set today  Visit Information  Last PT Received On: 06/27/12 Assistance Needed: +1    Subjective Data  Subjective: "I'm doing pretty well. Patient Stated Goal: To go home soon   Prior Functioning  Home Living Lives With: Spouse Available Help at Discharge: Family;Available 24 hours/day Type of Home: House Home Access: Stairs to enter [employer] of Steps: 1 Entrance Stairs-Rails: None Home Layout: Two level;Able to live on main level with bedroom/bathroom Bathroom Shower/Tub: Walk-in shower;Door [employer] Toilet: Standard Bathroom Accessibility: Yes How Accessible: Accessible via walker Home Adaptive Equipment: Bedside commode/3-in-1;Walker - rolling;Hand-held shower  hose Prior Function Level of Independence: Independent Able to Take Stairs?: Yes Driving: Yes Vocation: Full time  employment Comments: 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 ([address] Communication Communication: No difficulties Dominant Hand: Right    Cognition  Overall Cognitive Status: Appears within functional limits for tasks assessed/performed Arousal/Alertness: Awake/alert Orientation Level: Appears intact for tasks assessed Behavior During Session: [facility] for tasks performed    Extremity/Trunk Assessment Right Upper Extremity Assessment RUE ROM/Strength/Tone: Within functional levels Left Upper Extremity Assessment LUE ROM/Strength/Tone: Within functional levels Right Lower Extremity Assessment RLE ROM/Strength/Tone: Deficits;Unable to fully assess;Due to pain RLE ROM/Strength/Tone Deficits: Able to assist moving LE off bed. Left Lower Extremity Assessment LLE ROM/Strength/Tone: Within functional levels LLE Sensation: WFL - Light Touch Trunk Assessment Trunk Assessment: Normal   Balance Balance Balance Assessed: Yes Static Sitting Balance Static Sitting - Balance Support: No upper extremity supported;Feet supported Static Sitting - Level of Assistance: 5: Stand by assistance Static Sitting - Comment/# of Minutes: Good sitting balance x 6 minutes with standby assist.  End of Session PT - End of Session Equipment Utilized During Treatment: Gait belt;Oxygen Activity Tolerance: Patient limited by fatigue Patient left: in chair;with call bell/phone within reach;with family/visitor present Nurse Communication: Mobility status CPM Right Knee CPM Right Knee: Off  GP     Vena Austria 06/27/2012, 4:07 PM Durenda Hurt. Renaldo Fiddler, [facility] Pager [phone]

## 2012-06-27 NOTE — Progress Notes (Signed)
UR COMPLETED  

## 2012-06-27 NOTE — Op Note (Signed)
PATIENT ID:      Monica Zuniga  MRN:     [id number] DOB/AGE:    [birth year] / 46 y.o.       OPERATIVE REPORT    DATE OF PROCEDURE:  06/27/2012       PREOPERATIVE DIAGNOSIS:   OSTEOARTHRITIS RIGHT KNEE      Estimated Body mass index is 42.21 kg/(m^2) as calculated from the following:   Height as of 06/02/10: 5' 3.75"(1.619 m).   Weight as of 09/10/11: 244 lb(110.678 kg).                                                        POSTOPERATIVE DIAGNOSIS:   osteoarthritis right knee                                                                      PROCEDURE:  Procedure(s): TOTAL KNEE ARTHROPLASTY Using Depuy Sigma RP implants #3R Femur, #3Tibia, 10mm sigma RP bearing, 35 Patella     SURGEON: Laneah Luft Zuniga    ASSISTANT:   Shirl Harris PA-C   (Present and scrubbed throughout the case, critical for assistance with exposure, retraction, instrumentation, and closure.)         ANESTHESIA: GET with Femoral Nerve Block  DRAINS: foley, 2 medium hemovac in knee   TOURNIQUET TIME:   COMPLICATIONS:  None     SPECIMENS: None   INDICATIONS FOR PROCEDURE: The patient has  OSTEOARTHRITIS RIGHT KNEE, varus deformities, XR shows bone on bone arthritis. Patient has failed all conservative measures including anti-inflammatory medicines, narcotics, attempts at  exercise and weight loss, cortisone injections and viscosupplementation.  Risks and benefits of surgery have been discussed, questions answered.   DESCRIPTION OF PROCEDURE: The patient identified by armband, received  right femoral nerve block and IV antibiotics, in the holding area at [facility]. Patient taken to the operating room, appropriate anesthetic  monitors were attached General endotracheal anesthesia induced with  the patient in supine position, Foley catheter was inserted. Tourniquet  applied high to the operative thigh. Lateral post and foot positioner  applied to the table, the lower extremity was then prepped and  draped  in usual sterile fashion from the ankle to the tourniquet. Time-out procedure was performed. The limb was wrapped with an Esmarch bandage and the tourniquet inflated to 350 mmHg. We began the operation by making the anterior midline incision starting at handbreadth above the patella going over the patella 1 cm medial to and  4 cm distal to the tibial tubercle. Small bleeders in the skin and the  subcutaneous tissue identified and cauterized. Transverse retinaculum was incised and reflected medially and a medial parapatellar arthrotomy was accomplished. the patella was everted and theprepatellar fat pad resected. The superficial medial collateral  ligament was then elevated from anterior to posterior along the proximal  flare of the tibia and anterior half of the menisci resected. The knee was hyperflexed exposing bone on bone arthritis. Peripheral and notch osteophytes as well as the cruciate ligaments were then resected. We continued to  work our way  around posteriorly along the proximal tibia, and externally  rotated the tibia subluxing it out from underneath the femur. A McHale  retractor was placed through the notch and a lateral Hohmann retractor  placed, and we then drilled through the proximal tibia in line with the  axis of the tibia followed by an intramedullary guide rod and 2-degree  posterior slope cutting guide. The tibial cutting guide was pinned into place  allowing resection of 5 mm of bone medially and about 10 mm of bone  laterally because of her varus deformity. Satisfied with the tibial resection, we then  entered the distal femur 2 mm anterior to the PCL origin with the  intramedullary guide rod and applied the distal femoral cutting guide  set at 11mm, with 5 degrees of valgus. This was pinned along the  epicondylar axis. At this point, the distal femoral cut was accomplished without difficulty. We then sized for a #3R femoral component and pinned the guide in 3 degrees  of external rotation.The chamfer cutting guide was pinned into place. The anterior, posterior, and chamfer cuts were accomplished without difficulty followed by  the Sigma RP box cutting guide and the box cut. We also removed posterior osteophytes from the posterior femoral condyles. At this  time, the knee was brought into full extension. We checked our  extension and flexion gaps and found them symmetric at 10mm.  The patella thickness measured at 22 mm. We set the cutting guide at 13 and removed the posterior 9 mm  of the patella sized for 35 button and drilled the lollipop. The knee  was then once again hyperflexed exposing the proximal tibia. We sized for a #3 tibial base plate, applied the smokestack and the conical reamer followed by the the Delta fin keel punch. We then hammered into place the Sigma RP trial femoral component, inserted a 10-mm trial bearing, trial patellar button, and took the knee through range of motion from 0-130 degrees. No thumb pressure was required for patellar  tracking. At this point, all trial components were removed, a double batch of DePuy HV cement with 1500 mg of Zinacef was mixed and applied to all bony metallic mating surfaces except for the posterior condyles of the femur itself. In order, we  hammered into place the tibial tray and removed excess cement, the femoral component and removed excess cement, a 10-mm Sigma RP bearing  was inserted, and the knee brought to full extension with compression.  The patellar button was clamped into place, and excess cement  removed. While the cement cured the wound was irrigated out with normal saline solution pulse lavage, and medium Hemovac drains were placed from an anterolateral  approach. Ligament stability and patellar tracking were checked and found to be excellent. The parapatellar arthrotomy was closed with  running #1 Vicryl suture. The subcutaneous tissue with 0 and 2-0 undyed  Vicryl suture, and the skin with  skin staples. A dressing of Xeroform,  4 x 4, dressing sponges, Webril, and Ace wrap applied. The patient  awakened, extubated, and taken to recovery room without difficulty.   Monica Zuniga 06/27/2012, 8:55 AM

## 2012-06-27 NOTE — Evaluation (Signed)
Occupational Therapy Evaluation Patient Details Name: Monica Zuniga MRN: [id number] DOB: 12/04/1965 Today's Date: 06/27/2012 Time: 1191-4782 OT Time Calculation (min): 17 min  OT Assessment / Plan / Recommendation Clinical Impression  46 yo female s/p Rt TKA that coudl benefit from skilled OT acutely. Recommend HHOT at d/c    OT Assessment  Patient needs continued OT Services    Follow Up Recommendations  Home health OT    Barriers to Discharge      Equipment Recommendations  None recommended by OT    Recommendations for Other Services    Frequency  Min 2X/week    Precautions / Restrictions Precautions Precautions: Knee Restrictions RLE Weight Bearing: Weight bearing as tolerated   Pertinent Vitals/Pain Minimal pain at this time recently medicated    ADL  Grooming: Teeth care;Set up Where Assessed - Grooming: Supported sitting Lower Body Dressing:  (pt is one inch shy of touching toes- husband will (A) ) Where Assessed - Lower Body Dressing:  (long sitting position) Toilet Transfer: Min guard Toilet Transfer Method: Sit to stand Toilet Transfer Equipment: Raised toilet seat with arms (or 3-in-1 over toilet) Equipment Used: Rolling walker Transfers/Ambulation Related to ADLs: Pt completed sit<>stand from chair. pt reports knee still feeling slightly buckling with wbat. ADL Comments: Pt educated on CPM x2 hours max at a time and 6 hours total. pt recalling some informatoin from pre op course. Pt educated on transfering to strong left side. Pt provided oral care and ice for edema. Pt with Rt LE in extension at end of session. (educated NOT to sleep in CPM)    OT Diagnosis: Acute pain  OT Problem List: Decreased strength;Decreased activity tolerance;Decreased safety awareness;Decreased knowledge of use of DME or AE;Decreased knowledge of precautions;Pain OT Treatment Interventions: Self-care/ADL training;DME and/or AE instruction;Patient/family education;Balance training    OT Goals Acute Rehab OT Goals OT Goal Formulation: With patient/family Time For Goal Achievement: 07/11/12 Potential to Achieve Goals: Good ADL Goals Pt Will Transfer to Toilet: with modified independence;Ambulation;with DME ADL Goal: Toilet Transfer - Progress: Goal set today Pt Will Perform Tub/Shower Transfer: Shower transfer;with supervision;Ambulation ADL Goal: Tub/Shower Transfer - Progress: Goal set today Miscellaneous OT Goals Miscellaneous OT Goal #1: Pt will complete bed mobility MOD I with HOB < 20 degrees as precursor to adls OT Goal: Miscellaneous Goal #1 - Progress: Goal set today  Visit Information  Last OT Received On: 06/27/12 Assistance Needed: +1    Subjective Data  Subjective: "I had the other one done" Patient Stated Goal: to go home soon   Prior Functioning     Home Living Lives With: Spouse Available Help at Discharge: Family;Available 24 hours/day Type of Home: House Home Access: Stairs to enter [employer] of Steps: 1 Entrance Stairs-Rails: None Home Layout: Two level;Able to live on main level with bedroom/bathroom Bathroom Shower/Tub: Walk-in shower;Door [employer] Toilet: Standard Bathroom Accessibility: Yes How Accessible: Accessible via walker Home Adaptive Equipment: Bedside commode/3-in-1;Walker - rolling;Hand-held shower hose Prior Function Level of Independence: Independent Able to Take Stairs?: Yes Driving: Yes Vocation: Full time employment Comments: 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 ([address] Communication Communication: No difficulties Dominant Hand: Right         Vision/Perception     Cognition  Overall Cognitive Status: Appears within functional limits for tasks assessed/performed Arousal/Alertness: Awake/alert Orientation Level: Appears intact for tasks assessed Behavior During Session: [facility] for tasks performed    Extremity/Trunk Assessment Right Upper Extremity Assessment RUE  ROM/Strength/Tone: Within functional levels Left  Upper Extremity Assessment LUE ROM/Strength/Tone: Within functional levels Trunk Assessment Trunk Assessment: Normal     Mobility Bed Mobility Bed Mobility: Not assessed Transfers Transfers: Sit to Stand;Stand to Sit Sit to Stand: 4: Min guard;With upper extremity assist;From chair/3-in-1 Stand to Sit: 4: Min guard;With upper extremity assist;To chair/3-in-1 Details for Transfer Assistance: pt with good recall from previous surgery     Shoulder Instructions     Exercise     Balance     End of Session OT - End of Session Activity Tolerance: Patient tolerated treatment well Patient left: in chair;with call bell/phone within reach Nurse Communication: Mobility status CPM Right Knee CPM Right Knee: Off Right Knee Flexion (Degrees): 60  Right Knee Extension (Degrees): 0  Additional Comments: trapeze bar  GO     Lucile Shutters 06/27/2012, 3:25 PM Pager: [phone]

## 2012-06-27 NOTE — Interval H&P Note (Signed)
History and Physical Interval Note:  06/27/2012 7:18 AM  Monica Zuniga  has presented today for surgery, with the diagnosis of OSTEOARTHRITIS RIGHT KNEE  The various methods of treatment have been discussed with the patient and family. After consideration of risks, benefits and other options for treatment, the patient has consented to  Procedure(s) (LRB) with comments: TOTAL KNEE ARTHROPLASTY (Right) as a surgical intervention .  The patient's history has been reviewed, patient examined, no change in status, stable for surgery.  I have reviewed the patient's chart and labs.  Questions were answered to the patient's satisfaction.     Nestor Lewandowsky

## 2012-06-27 NOTE — Anesthesia Preprocedure Evaluation (Addendum)
Anesthesia Evaluation  Patient identified by MRN, date of birth, ID band Patient awake    Reviewed: Allergy & Precautions, H&P , NPO status , Patient's Chart, lab work & pertinent test results  Airway Mallampati: I      Dental  (+) Teeth Intact and Dental Advisory Given   Pulmonary neg pulmonary ROS,  breath sounds clear to auscultation  Pulmonary exam normal       Cardiovascular negative cardio ROS  Rhythm:Regular Rate:Normal     Neuro/Psych negative neurological ROS     GI/Hepatic negative GI ROS,   Endo/Other  Morbid obesity  Renal/GU negative Renal ROS     Musculoskeletal   Abdominal   Peds  Hematology negative hematology ROS (+)   Anesthesia Other Findings   Reproductive/Obstetrics                         Anesthesia Physical Anesthesia Plan  ASA: II  Anesthesia Plan: General   Post-op Pain Management:    Induction: Intravenous  Airway Management Planned: LMA  Additional Equipment:   Intra-op Plan:   Post-operative Plan: Extubation in OR  Informed Consent: I have reviewed the patients History and Physical, chart, labs and discussed the procedure including the risks, benefits and alternatives for the proposed anesthesia with the patient or authorized representative who has indicated his/her understanding and acceptance.   Dental advisory given  Plan Discussed with: CRNA, Anesthesiologist and Surgeon  Anesthesia Plan Comments:        Anesthesia Quick Evaluation

## 2012-06-27 NOTE — Progress Notes (Signed)
Orthopedic Tech Progress Note Patient Details:  Monica Zuniga April 18, 1966 [id number]  CPM Right Knee CPM Right Knee: On Right Knee Flexion (Degrees): 60  Right Knee Extension (Degrees): 0  Additional Comments: trapeze bar   Cammer, Mickie Bail 06/27/2012, 12:02 PM

## 2012-06-27 NOTE — Anesthesia Postprocedure Evaluation (Signed)
Anesthesia Post Note  Patient: Monica Zuniga  Procedure(s) Performed: Procedure(s) (LRB): TOTAL KNEE ARTHROPLASTY (Right)  Anesthesia type: general  Patient location: PACU  Post pain: Pain level controlled  Post assessment: Patient's Cardiovascular Status Stable  Last Vitals:  Filed Vitals:   06/27/12 1000  BP:   Pulse: 74  Temp:   Resp: 19    Post vital signs: Reviewed and stable  Level of consciousness: sedated  Complications: No apparent anesthesia complications

## 2012-06-27 NOTE — Preoperative (Signed)
Beta Blockers   Reason not to administer Beta Blockers:Not Applicable 

## 2012-06-27 NOTE — Transfer of Care (Signed)
Immediate Anesthesia Transfer of Care Note  Patient: Monica Zuniga  Procedure(s) Performed: Procedure(s) (LRB) with comments: TOTAL KNEE ARTHROPLASTY (Right)  Patient Location: PACU  Anesthesia Type:General  Level of Consciousness: awake, alert  and oriented  Airway & Oxygen Therapy: Patient Spontanous Breathing and Patient connected to nasal cannula oxygen  Post-op Assessment: Report given to PACU RN, Post -op Vital signs reviewed and stable and Patient moving all extremities X 4  Post vital signs: Reviewed and stable  Complications: No apparent anesthesia complications

## 2012-06-27 NOTE — Addendum Note (Signed)
Addendum  created 06/27/12 1105 by Remonia Richter, MD   Modules edited:Anesthesia Medication Administration

## 2012-06-27 NOTE — Anesthesia Procedure Notes (Addendum)
Anesthesia Regional Block:  Femoral nerve block  Pre-Anesthetic Checklist: ,, timeout performed, Correct Patient, Correct Site, Correct Laterality, Correct Procedure, Correct Position, site marked, Risks and benefits discussed,  Surgical consent,  Pre-op evaluation,  At surgeon's request and post-op pain management  Laterality: Right  Prep: chloraprep       Needles:  Injection technique: Single-shot  Needle Type: Echogenic Stimulator Needle     Needle Length: 5cm 5 cm Needle Gauge: 22 and 22 G    Additional Needles:  Procedures: ultrasound guided (picture in chart) and nerve stimulator Femoral nerve block  Nerve Stimulator or Paresthesia:  Response: quadraceps contraction, 0.45 mA,   Additional Responses:   Narrative:  Start time: 06/27/2012 7:01 AM End time: 06/27/2012 7:11 AM Injection made incrementally with aspirations every 5 mL.  Performed by: Personally  Anesthesiologist: Halford Decamp, MD  Additional Notes: Functioning IV was confirmed and monitors were applied.  A 50mm 22ga Arrow echogenic stimulator needle was used. Sterile prep and drape,hand hygiene and sterile gloves were used. Ultrasound guidance: relevant anatomy identified, needle position confirmed, local anesthetic spread visualized around nerve(s)., vascular puncture avoided.  Image printed for medical record. Negative aspiration and negative test dose prior to incremental administration of local anesthetic. The patient tolerated the procedure well.    Femoral nerve block Procedure Name: LMA Insertion Date/Time: 06/27/2012 7:29 AM Performed by: Sharlene Dory E Pre-anesthesia Checklist: Patient identified, Emergency Drugs available, Suction available, Patient being monitored and Timeout performed Patient Re-evaluated:Patient Re-evaluated prior to inductionOxygen Delivery Method: Circle system utilized Preoxygenation: Pre-oxygenation with 100% oxygen Intubation Type: IV induction Ventilation: Mask  ventilation without difficulty LMA: LMA inserted LMA Size: 4.0 Number of attempts: 1 Placement Confirmation: positive ETCO2 and breath sounds checked- equal and bilateral Tube secured with: Tape Dental Injury: Teeth and Oropharynx as per pre-operative assessment

## 2012-06-28 ENCOUNTER — Encounter (HOSPITAL_COMMUNITY): Payer: Self-pay | Admitting: General Practice

## 2012-06-28 LAB — BASIC METABOLIC PANEL
CO2: 30 mEq/L (ref 19–32)
Chloride: 101 mEq/L (ref 96–112)
Glucose, Bld: 115 mg/dL — ABNORMAL HIGH (ref 70–99)
Potassium: 4.1 mEq/L (ref 3.5–5.1)
Sodium: 137 mEq/L (ref 135–145)

## 2012-06-28 LAB — CBC
Hemoglobin: 11.1 g/dL — ABNORMAL LOW (ref 12.0–15.0)
MCH: 30.2 pg (ref 26.0–34.0)
RBC: 3.68 MIL/uL — ABNORMAL LOW (ref 3.87–5.11)

## 2012-06-28 MED ORDER — ASPIRIN 325 MG PO TBEC: 325.0000 mg | DELAYED_RELEASE_TABLET | Freq: Two times a day (BID) | ORAL | Status: DC

## 2012-06-28 MED ORDER — OXYCODONE HCL 5 MG PO TABS: 5.0000 mg | ORAL_TABLET | ORAL | Status: DC | PRN

## 2012-06-28 MED ORDER — METHOCARBAMOL 500 MG PO TABS: 500.0000 mg | ORAL_TABLET | Freq: Four times a day (QID) | ORAL | Status: DC | PRN

## 2012-06-28 NOTE — Progress Notes (Signed)
Patient ID: Monica Zuniga, female   DOB: 1966/05/28, 46 y.o.   MRN: [id number] PATIENT ID: Monica Zuniga  MRN: [id number]  DOB/AGE:  July 15, 1966 / 46 y.o.  1 Day Post-Op Procedure(s) (LRB): TOTAL KNEE ARTHROPLASTY (Right)    PROGRESS NOTE Subjective: Patient is alert, oriented, no Nausea, no Vomiting, yes passing gas, no Bowel Movement. Taking PO well. Denies SOB, Chest or Calf Pain. Using Incentive Spirometer, PAS in place. Ambulate weight bearing as tolerated, CPM 0-40 Patient reports pain as 2 on 0-10 scale  .    Objective: Vital signs in last 24 hours: Filed Vitals:   06/27/12 1500 06/27/12 2000 06/27/12 2258 06/28/12 0631  BP: 110/68  94/51 95/51  Pulse: 72  75 75  Temp: 98.1 F (36.7 C)  97.3 F (36.3 C) 98 F (36.7 C)  TempSrc:      Resp: 16 18 16 16   SpO2: 99% 97% 99% 98%      Intake/Output from previous day: I/O last 3 completed shifts: In: 1705 [P.O.:480; I.V.:1225] Out: 2625 [Urine:2500; Drains:100; Blood:25]   Intake/Output this shift: Total I/O In: 1687.5 [I.V.:1687.5] Out: 2450 [Urine:2300; Drains:150]   LABORATORY DATA:  Basename 06/28/12 0545  WBC 10.4  HGB 11.1*  HCT 32.7*  PLT 169  NA 137  K 4.1  CL 101  CO2 30  BUN 9  CREATININE 0.61  GLUCOSE 115*  GLUCAP --  INR --  CALCIUM 8.6    Examination: Neurologically intact ABD soft Neurovascular intact Sensation intact distally Intact pulses distally Dorsiflexion/Plantar flexion intact Incision: scant drainage No cellulitis present Compartment soft}  Assessment:   1 Day Post-Op Procedure(s) (LRB): TOTAL KNEE ARTHROPLASTY (Right) ADDITIONAL DIAGNOSIS:    Plan: PT/OT WBAT, CPM 5/hrs day until ROM 0-90 degrees, then D/C CPM, patient would like to attempt to go home today and can if she passes PT. DVT Prophylaxis:  SCDx72hrs, ASA 325 mg BID x 2 weeks DISCHARGE PLAN: Home DISCHARGE NEEDS: HHPT, HHRN, CPM, Walker and 3-in-1 comode seat     Paw Karstens J 06/28/2012, 6:55 AM

## 2012-06-28 NOTE — Discharge Summary (Signed)
Patient ID: Monica Zuniga MRN: [id number] DOB/AGE: 1966/05/19 46 y.o.  Admit date: 06/27/2012 Discharge date: 06/28/2012  Admission Diagnoses:  Active Problems:  * No active hospital problems. *    Discharge Diagnoses:  Same  Past Medical History  Diagnosis Date  . 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 female who was admitted 06/27/2012 for operative treatment of<principal problem not specified>. Patient has severe unremitting pain that affects sleep, daily activities, and work/hobbies. After pre-op clearance the patient was taken to the operating room on 06/27/2012 and underwent  Procedure(s): TOTAL KNEE ARTHROPLASTY.  The next morning the patient actually had dramatic pain relief compared to preop, physical therapy and got her out of bed the evening of surgery. The plan is to allow discharge today if she passes physical therapy.  Patient was given perioperative antibiotics: Anti-infectives     Start     Dose/Rate Route Frequency Ordered Stop   06/27/12 0810   cefUROXime (ZINACEF) injection  Status:  Discontinued          As needed 06/27/12 0810 06/27/12 0916   06/26/12 1150   ceFAZolin (ANCEF) IVPB 2 g/50 mL premix        2 g 100 mL/hr over 30 Minutes Intravenous 60 min pre-op 06/26/12 1150 06/27/12 0732           Patient was given sequential compression devices, early ambulation, and chemoprophylaxis to prevent DVT.  Patient benefited maximally from hospital stay and there were no complications.    Recent vital signs: Patient Vitals for the past 24 hrs:  BP Temp Temp src Pulse Resp SpO2  06/28/12 0631 95/51 mmHg 98 F (36.7 C) - 75  16  98 %  06/27/12 2258 94/51 mmHg 97.3 F (36.3 C) - 75  16  99 %  06/27/12 2000 - - - - 18  97 %  06/27/12 1500 110/68 mmHg 98.1 F (36.7 C) - 72  16  99 %   06/27/12 1105 118/70 mmHg 98.1 F (36.7 C) Oral 78  16  99 %  06/27/12 1045 - 97.6 F (36.4 C) - - - -  06/27/12 1040 103/65 mmHg - - 84  17  100 %  06/27/12 1030 - - - 77  14  99 %  06/27/12 1025 103/65 mmHg - - 80  17  99 %  06/27/12 1015 - - - 78  17  100 %  06/27/12 1010 107/57 mmHg - - 76  18  99 %  06/27/12 1000 - - - 74  19  100 %  06/27/12 0955 109/96 mmHg - - 72  17  100 %  06/27/12 0945 - - - 72  22  100 %  06/27/12 0940 104/62 mmHg - - 78  18  100 %  06/27/12 0934 107/59 mmHg - - 74  20  100 %  06/27/12 0930 - - - 76  17  100 %  06/27/12 0927 92/66 mmHg 97.8 F (36.6 C) - 84  17  99 %     Recent laboratory studies:  [facility] 06/28/12 0545  WBC 10.4  HGB 11.1*  HCT 32.7*  PLT 169  NA 137  K 4.1  CL 101  CO2 30  BUN 9  CREATININE 0.61  GLUCOSE 115*  INR --  CALCIUM 8.6     Discharge Medications:  Medication List     As of 06/28/2012  7:03 AM    STOP taking these medications         naproxen sodium 220 MG tablet   Commonly known as: ANAPROX      TAKE these medications         acetaminophen 500 MG tablet   Commonly known as: TYLENOL   Take 500 mg by mouth 3 (three) times daily as needed. For pain      aspirin 325 MG EC tablet   Take 1 tablet (325 mg total) by mouth 2 (two) times daily.      aspirin EC 81 MG tablet   Take 81 mg by mouth daily.      GLUCOSAMINE PO   Take 1 tablet by mouth 2 (two) times daily.      methocarbamol 500 MG tablet   Commonly known as: ROBAXIN   Take 1 tablet (500 mg total) by mouth every 6 (six) hours as needed.      multivitamin with minerals Tabs   Take 1 tablet by mouth daily.      oxyCODONE 5 MG immediate release tablet   Commonly known as: Oxy IR/ROXICODONE   Take 1-2 tablets (5-10 mg total) by mouth every 3 (three) hours as needed.        Diagnostic Studies: No results found.  Disposition:  the patient will be discharged to home when she passes physical therapy, home health will come out for  physical therapy, and wound care.      Discharge Orders    Future Orders Please Complete By Expires   Diet - low sodium heart healthy      Call MD / Call 911      Comments:   If you experience chest pain or shortness of breath, CALL 911 and be transported to the hospital emergency room.  If you develope a fever above 101 F, pus (white drainage) or increased drainage or redness at the wound, or calf pain, call your surgeon's office.   Constipation Prevention      Comments:   Drink plenty of fluids.  Prune juice may be helpful.  You may use a stool softener, such as Colace (over the counter) 100 mg twice a day.  Use MiraLax (over the counter) for constipation as needed.   Increase activity slowly as tolerated      Patient may shower      Comments:   You may shower without a dressing once there is no drainage.  Do not wash over the wound.  If drainage remains, cover wound with plastic wrap and then shower.   Driving restrictions      Comments:   No driving for 2 weeks   CPM      Comments:   Continuous passive motion machine (CPM):      Use the CPM from 0 to 60 for 5 hours per day.      You may increase by 10 degrees per day.  You may break it up into 2 or 3 sessions per day.      Use CPM for 2 weeks or until you are told to stop.   Change dressing      Comments:   Change dressing on POD #5.  You may clean the incision with alcohol prior to redressing.   Do not put a pillow under the knee. Place it under the heel.         Follow-up Information  Follow up with Nestor Lewandowsky, MD. In 2 weeks. (Followup should be 10-14 days after surgery.)    Contact information:   [address] [place] [address] [phone]           Signed: Nestor Lewandowsky 06/28/2012, 7:03 AM

## 2012-06-28 NOTE — Progress Notes (Signed)
Physical Therapy Treatment Patient Details Name: SIOMARA BURKEL MRN: [id number] DOB: [birth year] Today's Date: 06/28/2012 Time: 4098-1191 PT Time Calculation (min): 43 min  PT Assessment / Plan / Recommendation Comments on Treatment Session  Pt has met most acute PT goals and is appropriate to d/c home when cleared by MD    Follow Up Recommendations  Home health PT;Supervision/Assistance - 24 hour     Does the patient have the potential to tolerate intense rehabilitation     Barriers to Discharge        Equipment Recommendations  None recommended by PT    Recommendations for Other Services    Frequency 7X/week   Plan All goals met and education completed, patient dischaged from PT services    Precautions / Restrictions Precautions Precautions: Knee Precaution Comments: Reviewed precautions with patient and husband. Restrictions Weight Bearing Restrictions: Yes RLE Weight Bearing: Weight bearing as tolerated   Pertinent Vitals/Pain Pt reporting 2/10 pain in knee.  Medicated 30 min prior to session.      Mobility  Bed Mobility Bed Mobility: Supine to Sit;Sit to Supine Supine to Sit: 5: Supervision;HOB flat Sitting - Scoot to Edge of Bed: 5: Supervision Sit to Supine: 5: Supervision;HOB flat Details for Bed Mobility Assistance: Verbal cues for technique pt able to perform task without assistance.  Transfers Transfers: Sit to Stand;Stand to Sit Sit to Stand: 6: Modified independent (Device/Increase time);5: Supervision;From bed;From chair/3-in-1;With upper extremity assist Stand to Sit: 5: Supervision;6: Modified independent (Device/Increase time);To chair/3-in-1;To bed;With upper extremity assist Stand Pivot Transfers: 5: Supervision;6: Modified independent (Device/Increase time) Details for Transfer Assistance: Supervision for safety pt demonstates sound and safe technique.  Ambulation/Gait Ambulation/Gait Assistance: 5: Supervision Ambulation Distance (Feet): 125  Feet Assistive device: Rolling walker Ambulation/Gait Assistance Details: Verbal cues to decrease step length for increase control over R knee extension.  Gait Pattern: Step-to pattern General Gait Details: Pt has difficulty maintaining R knee extension in R stance phase.  Likely due to nerve block.   Stairs: Yes Stairs Assistance: 5: Supervision Stairs Assistance Details (indicate cue type and reason): instructed pt in posterior approach secondary to weakness in R knee.  Stair Management Technique: With walker;Backwards Number of Stairs: 1  Wheelchair Mobility Wheelchair Mobility: No    Exercises Total Joint Exercises Ankle Circles/Pumps: AROM;Both;10 reps;Seated Quad Sets: Right;AROM;10 reps;Supine Short Arc Quad: Right;5 reps;AAROM;Supine (Pt unable to lift R LE without assistance. ) Heel Slides: 10 reps;Right;Supine;AROM Goniometric ROM: 12 degrees short of full extension, 68 degrees of flexion.    PT Diagnosis:    PT Problem List:   PT Treatment Interventions:     PT Goals Acute Rehab PT Goals PT Goal Formulation: With patient/family Time For Goal Achievement: 07/04/12 Potential to Achieve Goals: Good Pt will go Supine/Side to Sit: with supervision;with HOB 0 degrees PT Goal: Supine/Side to Sit - Progress: Met Pt will go Sit to Supine/Side: with supervision;with HOB 0 degrees PT Goal: Sit to Supine/Side - Progress: Met Pt will go Sit to Stand: with supervision;with upper extremity assist PT Goal: Sit to Stand - Progress: Met Pt will go Stand to Sit: with supervision;with upper extremity assist PT Goal: Stand to Sit - Progress: Met Pt will Ambulate: >150 feet;with supervision;with rolling walker PT Goal: Ambulate - Progress: Partly met Pt will Go Up / Down Stairs: 1-2 stairs;with supervision;with rolling walker PT Goal: Up/Down Stairs - Progress: Met Pt will Perform Home Exercise Program: Independently PT Goal: Perform Home Exercise Program - Progress: Partly  met  Visit  Information  Last PT Received On: 06/28/12 Assistance Needed: +1    Subjective Data  Subjective: "I'm doing pretty well. Patient Stated Goal: To go home soon   Cognition  Overall Cognitive Status: Appears within functional limits for tasks assessed/performed Arousal/Alertness: Awake/alert Orientation Level: Appears intact for tasks assessed Behavior During Session: [facility] for tasks performed    Balance  Balance Balance Assessed: No  End of Session PT - End of Session Equipment Utilized During Treatment: Gait belt Activity Tolerance: Patient tolerated treatment well Patient left: in CPM;in bed;with call bell/phone within reach Nurse Communication: Mobility status   GP     Azelia Reiger 06/28/2012, 12:38 PM Devinne Epstein L. Luby Seamans DPT [phone]

## 2012-06-28 NOTE — Progress Notes (Signed)
CARE MANAGEMENT NOTE 06/28/2012  Patient:  Monica Zuniga, Monica Zuniga   Account Number:  [id number]  Date Initiated:  06/28/2012  Documentation initiated by:  Vance Peper  Subjective/Objective Assessment:   46 yr old female s/p right total knee arthroplasty.     Action/Plan:   Patient preoperatively setup with [facility]. No changes. rolling walker, 3in1 and CPM have been delivered to her home.   Anticipated DC Date:  06/28/2012   Anticipated DC Plan:  HOME W HOME HEALTH SERVICES      DC Planning Services  CM consult      [facility]  HOME HEALTH   Choice offered to / List presented to:          [facility] arranged  HH-2 PT      [facility] agency  [facility]   Status of service:  Completed, signed off Medicare Important Message given?   (If response is "NO", the following Medicare IM given date fields will be blank) Date Medicare IM given:   Date Additional Medicare IM given:    Discharge Disposition:  HOME W HOME HEALTH SERVICES  Per UR Regulation:    If discussed at Long Length of Stay Meetings, dates discussed:    Comments:

## 2014-01-10 ENCOUNTER — Ambulatory Visit (INDEPENDENT_AMBULATORY_CARE_PROVIDER_SITE_OTHER): Payer: BC Managed Care – HMO | Admitting: Family Medicine

## 2014-01-10 VITALS — BP 120/70 | HR 96 | Temp 97.9°F | Wt 262.0 lb

## 2014-01-10 DIAGNOSIS — Z0000 Encounter for general adult medical examination without abnormal findings: Secondary | ICD-10-CM

## 2014-01-11 MED ORDER — CIPROFLOXACIN HCL 500 MG PO TABS: ORAL_TABLET | ORAL | Status: DC

## 2014-01-11 MED ORDER — ONDANSETRON 8 MG PO TBDP: 8.0000 mg | ORAL_TABLET | Freq: Three times a day (TID) | ORAL | Status: DC | PRN

## 2014-01-11 NOTE — Progress Notes (Signed)
Subjective:    Patient ID: Monica Zuniga, female    DOB: 1966-03-05, 48 y.o.   MRN: [id number]  HPI Here for well visit.  She sees gyn for paps and mammograms.  Tetanus 2011.  Bilateral knee replacements within past couple of years.  Some weight gain during that time and she attributes some of that to stress eating with work stress.  Plans to start calorie restriction program and exercise soon.  Scaly, irritated place lower thoracic area.  Itches and she scratches and it bleeds occasionally.  No hx of skin cancer. Nonsmoker.  Past Medical History  Diagnosis Date  . 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 Knee arthroscopy      left knee scope 2005, right 2008  . Joint replacement      L- replacement- 2011  . Wisdom tooth extraction      /w sedation  . Eye surgery      as a baby- surgery for eye problem   . 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 Total knee arthroplasty  06/27/2012    Procedure: TOTAL KNEE ARTHROPLASTY;  Surgeon: Kerin Salen, MD;  Location: [place];  Service: Orthopedics;  Laterality: Right;    reports that she has never smoked. She has never used smokeless tobacco. She reports that she drinks alcohol. She reports that she does not use illicit drugs. family history includes Arthritis in her other; Diabetes in her paternal grandmother. Allergies  Allergen Reactions  . Shellfish Allergy Other (See Comments)    Numbness       Review of Systems  Constitutional: Negative for fever, activity change, appetite change, fatigue and unexpected weight change.  HENT: Negative for ear pain, hearing loss, sore throat and trouble swallowing.   Eyes: Negative for visual disturbance.  Respiratory: Negative for cough and shortness of breath.   Cardiovascular: Negative for chest pain and palpitations.  Gastrointestinal: Negative for abdominal pain, diarrhea, constipation and blood in stool.    Genitourinary: Negative for dysuria and hematuria.  Musculoskeletal: Negative for arthralgias, back pain and myalgias.  Skin: Negative for rash.  Neurological: Negative for dizziness, syncope and headaches.  Hematological: Negative for adenopathy.  Psychiatric/Behavioral: Negative for confusion and dysphoric mood.       Objective:   Physical Exam  Constitutional: She is oriented to person, place, and time. She appears well-developed and well-nourished.  HENT:  Head: Normocephalic and atraumatic.  Eyes: EOM are normal. Pupils are equal, round, and reactive to light.  Neck: Normal range of motion. Neck supple. No thyromegaly present.  Cardiovascular: Normal rate, regular rhythm and normal heart sounds.   No murmur heard. Pulmonary/Chest: Breath sounds normal. No respiratory distress. She has no wheezes. She has no rales.  Abdominal: Soft. Bowel sounds are normal. She exhibits no distension and no mass. There is no tenderness. There is no rebound and no guarding.  Genitourinary:  Per gyn  Musculoskeletal: Normal range of motion. She exhibits no edema.  Lymphadenopathy:    She has no cervical adenopathy.  Neurological: She is alert and oriented to person, place, and time. She displays normal reflexes. No cranial nerve deficit.  Skin:  Lower  Thoracic back.  Slightly erythematous scaly lesion about 8 mm diameter.  No pigment.  No nodules.Marland Kitchen  Psychiatric: She has a normal mood and affect. Her behavior is normal. Judgment and thought content normal.  Assessment & Plan:  CPE.  Patient had recent labs per work and she will get [place] those for review.  Tetanus up to date.  Continue with gyn follow up.  Recommend shave bx lower back lesion, though suspect irritated seborrheic keratosis. Discussed weight loss strategies.  She will consider application to help monitor calorie intake.

## 2014-10-04 ENCOUNTER — Encounter: Payer: Self-pay | Admitting: Family Medicine

## 2014-10-04 ENCOUNTER — Ambulatory Visit (INDEPENDENT_AMBULATORY_CARE_PROVIDER_SITE_OTHER): Payer: BLUE CROSS/BLUE SHIELD | Admitting: Family Medicine

## 2014-10-04 VITALS — BP 116/80 | HR 75 | Temp 97.6°F | Wt 262.0 lb

## 2014-10-04 DIAGNOSIS — N3001 Acute cystitis with hematuria: Secondary | ICD-10-CM

## 2014-10-04 DIAGNOSIS — R300 Dysuria: Secondary | ICD-10-CM

## 2014-10-04 LAB — POCT URINALYSIS DIPSTICK
BILIRUBIN UA: NEGATIVE
GLUCOSE UA: NEGATIVE
Ketones, UA: NEGATIVE
Nitrite, UA: POSITIVE
Spec Grav, UA: 1.01
UROBILINOGEN UA: 0.2
pH, UA: 7

## 2014-10-04 MED ORDER — NITROFURANTOIN MONOHYD MACRO 100 MG PO CAPS: 100.0000 mg | ORAL_CAPSULE | Freq: Two times a day (BID) | ORAL | Status: DC

## 2014-10-04 NOTE — Progress Notes (Signed)
   Subjective:    Patient ID: Monica Zuniga, female    DOB: 1966-07-06, 49 y.o.   MRN: [id number]  HPI Acute visit. Patient seen with dysuria with onset Monday. She's had some cloudy urine and burning with urination. No gross hematuria. No fevers or chills. No back pain. History of UTIs in the past but not for several years. No known drug allergies.  Past Medical History  Diagnosis Date  . 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 Knee arthroscopy      left knee scope 2005, right 2008  . Joint replacement      L- replacement- 2011  . Wisdom tooth extraction      /w sedation  . Eye surgery      as a baby- surgery for eye problem   . 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 Total knee arthroplasty  06/27/2012    Procedure: TOTAL KNEE ARTHROPLASTY;  Surgeon: Kerin Salen, MD;  Location: [place];  Service: Orthopedics;  Laterality: Right;    reports that she has never smoked. She has never used smokeless tobacco. She reports that she drinks alcohol. She reports that she does not use illicit drugs. family history includes Arthritis in her other; Diabetes in her paternal grandmother. Allergies  Allergen Reactions  . Shellfish Allergy Other (See Comments)    Numbness       Review of Systems  Constitutional: Negative for fever, chills and appetite change.  Gastrointestinal: Negative for nausea, vomiting, abdominal pain, diarrhea and constipation.  Genitourinary: Positive for dysuria and frequency.  Musculoskeletal: Negative for back pain.  Neurological: Negative for dizziness.       Objective:   Physical Exam  Constitutional: She appears well-developed and well-nourished.  HENT:  Head: Normocephalic and atraumatic.  Cardiovascular: Normal rate, regular rhythm and normal heart sounds.   Pulmonary/Chest: Breath sounds normal.          Assessment & Plan:  Uncomplicated cystitis. Macrobid 1 twice a day  for 5 days. Follow-up as needed

## 2014-10-04 NOTE — Patient Instructions (Signed)

## 2014-10-04 NOTE — Progress Notes (Signed)
Pre visit review using our clinic review tool, if applicable. No additional management support is needed unless otherwise documented below in the visit note. 

## 2014-10-07 LAB — URINE CULTURE: Colony Count: >=100000

## 2015-04-11 ENCOUNTER — Ambulatory Visit (INDEPENDENT_AMBULATORY_CARE_PROVIDER_SITE_OTHER): Payer: BLUE CROSS/BLUE SHIELD | Admitting: Family Medicine

## 2015-04-11 ENCOUNTER — Encounter: Payer: Self-pay | Admitting: Family Medicine

## 2015-04-11 VITALS — BP 120/90 | HR 72 | Temp 98.1°F | Ht 64.0 in | Wt 250.8 lb

## 2015-04-11 DIAGNOSIS — Z0000 Encounter for general adult medical examination without abnormal findings: Secondary | ICD-10-CM

## 2015-04-11 DIAGNOSIS — Z23 Encounter for immunization: Secondary | ICD-10-CM | POA: Diagnosis not present

## 2015-04-11 LAB — HEPATIC FUNCTION PANEL
ALBUMIN: 4.3 g/dL (ref 3.5–5.2)
ALT: 23 U/L (ref 0–35)
AST: 22 U/L (ref 0–37)
Alkaline Phosphatase: 81 U/L (ref 39–117)
Bilirubin, Direct: 0.1 mg/dL (ref 0.0–0.3)
TOTAL PROTEIN: 7.1 g/dL (ref 6.0–8.3)
Total Bilirubin: 0.5 mg/dL (ref 0.2–1.2)

## 2015-04-11 LAB — LIPID PANEL
CHOLESTEROL: 181 mg/dL (ref 0–200)
HDL: 75.4 mg/dL (ref >39.00)
LDL Cholesterol: 88 mg/dL (ref 0–99)
NonHDL: 106.05
Total CHOL/HDL Ratio: 2
Triglycerides: 92 mg/dL (ref 0.0–149.0)
VLDL: 18.4 mg/dL (ref 0.0–40.0)

## 2015-04-11 LAB — BASIC METABOLIC PANEL
BUN: 17 mg/dL (ref 6–23)
CALCIUM: 9.5 mg/dL (ref 8.4–10.5)
CO2: 30 mEq/L (ref 19–32)
Chloride: 101 mEq/L (ref 96–112)
Creatinine, Ser: 0.7 mg/dL (ref 0.40–1.20)
GFR: 94.02 mL/min (ref >60.00)
Glucose, Bld: 99 mg/dL (ref 70–99)
POTASSIUM: 4.5 meq/L (ref 3.5–5.1)
SODIUM: 139 meq/L (ref 135–145)

## 2015-04-11 LAB — CBC WITH DIFFERENTIAL/PLATELET
BASOS PCT: 0.5 % (ref 0.0–3.0)
Basophils Absolute: 0 10*3/uL (ref 0.0–0.1)
EOS PCT: 1.1 % (ref 0.0–5.0)
Eosinophils Absolute: 0.1 10*3/uL (ref 0.0–0.7)
HCT: 41.8 % (ref 36.0–46.0)
HEMOGLOBIN: 14.2 g/dL (ref 12.0–15.0)
LYMPHS ABS: 2.3 10*3/uL (ref 0.7–4.0)
Lymphocytes Relative: 30.1 % (ref 12.0–46.0)
MCHC: 33.9 g/dL (ref 30.0–36.0)
MCV: 87.5 fl (ref 78.0–100.0)
MONO ABS: 0.5 10*3/uL (ref 0.1–1.0)
MONOS PCT: 7.1 % (ref 3.0–12.0)
Neutro Abs: 4.7 10*3/uL (ref 1.4–7.7)
Neutrophils Relative %: 61.2 % (ref 43.0–77.0)
Platelets: 228 10*3/uL (ref 150.0–400.0)
RBC: 4.78 Mil/uL (ref 3.87–5.11)
RDW: 14 % (ref 11.5–15.5)
WBC: 7.6 10*3/uL (ref 4.0–10.5)

## 2015-04-11 LAB — TSH: TSH: 3.5 u[IU]/mL (ref 0.35–4.50)

## 2015-04-11 NOTE — Progress Notes (Signed)
Pre visit review using our clinic review tool, if applicable. No additional management support is needed unless otherwise documented below in the visit note. 

## 2015-04-11 NOTE — Progress Notes (Signed)
   Subjective:    Patient ID: Monica Zuniga, female    DOB: 20-Jan-1966, 49 y.o.   MRN: [id number]  HPI Patient seen for complete physical. She is currently working with someone on weight loss program and has lost 12 pounds since last year. She is walking some for exercise.  Turned 49 this year. No prior colonoscopy screening. Generally very healthy.  Nonsmoker. Takes no regular prescription medications\  Sees gyn for pap smears and mammograms and these are up-to-date  Past Medical History  Diagnosis Date  . 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 Knee arthroscopy      left knee scope 2005, right 2008  . Joint replacement      L- replacement- 2011  . Wisdom tooth extraction      /w sedation  . Eye surgery      as a baby- surgery for eye problem   . 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 Total knee arthroplasty  06/27/2012    Procedure: TOTAL KNEE ARTHROPLASTY;  Surgeon: Kerin Salen, MD;  Location: [place];  Service: Orthopedics;  Laterality: Right;    reports that she has never smoked. She has never used smokeless tobacco. She reports that she drinks alcohol. She reports that she does not use illicit drugs. family history includes Arthritis in her other; Diabetes in her paternal grandmother. Allergies  Allergen Reactions  . Shellfish Allergy Other (See Comments)    Numbness       Review of Systems  Constitutional: Negative for fever, activity change, appetite change, fatigue and unexpected weight change.  HENT: Negative for ear pain, hearing loss, sore throat and trouble swallowing.   Eyes: Negative for visual disturbance.  Respiratory: Negative for cough and shortness of breath.   Cardiovascular: Negative for chest pain and palpitations.  Gastrointestinal: Negative for abdominal pain, diarrhea, constipation and blood in stool.  Genitourinary: Negative for dysuria and hematuria.  Musculoskeletal:  Negative for myalgias, back pain and arthralgias.  Skin: Negative for rash.  Neurological: Negative for dizziness, syncope and headaches.  Hematological: Negative for adenopathy.  Psychiatric/Behavioral: Negative for confusion and dysphoric mood.       Objective:   Physical Exam  Constitutional: She is oriented to person, place, and time. She appears well-developed and well-nourished.  HENT:  Head: Normocephalic and atraumatic.  Eyes: EOM are normal. Pupils are equal, round, and reactive to light.  Neck: Normal range of motion. Neck supple. No thyromegaly present.  Cardiovascular: Normal rate, regular rhythm and normal heart sounds.   No murmur heard. Pulmonary/Chest: Breath sounds normal. No respiratory distress. She has no wheezes. She has no rales.  Abdominal: Soft. Bowel sounds are normal. She exhibits no distension and no mass. There is no tenderness. There is no rebound and no guarding.  Genitourinary:  Per gyn  Musculoskeletal: Normal range of motion. She exhibits no edema.  Lymphadenopathy:    She has no cervical adenopathy.  Neurological: She is alert and oriented to person, place, and time. She displays normal reflexes. No cranial nerve deficit.  Skin: No rash noted.  Psychiatric: She has a normal mood and affect. Her behavior is normal. Judgment and thought content normal.          Assessment & Plan:  Health maintenance exam. Schedule screening colonoscopy. Ordered screening labs. Continue weight loss efforts.

## 2015-05-06 ENCOUNTER — Encounter: Payer: Self-pay | Admitting: Gastroenterology

## 2015-07-01 ENCOUNTER — Ambulatory Visit (AMBULATORY_SURGERY_CENTER): Payer: Self-pay | Admitting: *Deleted

## 2015-07-01 VITALS — Ht 64.0 in | Wt 256.0 lb

## 2015-07-01 DIAGNOSIS — Z1211 Encounter for screening for malignant neoplasm of colon: Secondary | ICD-10-CM

## 2015-07-01 MED ORDER — NA SULFATE-K SULFATE-MG SULF 17.5-3.13-1.6 GM/177ML PO SOLN: ORAL | Status: DC

## 2015-07-01 NOTE — Progress Notes (Signed)
Patient denies any allergies to eggs or soy. Patient denies any problems with anesthesia/sedation, patient states she gets the "shakes" after surgeries. Patient denies any oxygen use at home and does not take any diet/weight loss medications. EMMI education assisgned to patient on colonoscopy, this was explained and instructions given to patient.

## 2015-07-02 ENCOUNTER — Encounter: Payer: Self-pay | Admitting: Gastroenterology

## 2015-07-15 ENCOUNTER — Ambulatory Visit (AMBULATORY_SURGERY_CENTER): Payer: BLUE CROSS/BLUE SHIELD | Admitting: Gastroenterology

## 2015-07-15 ENCOUNTER — Encounter: Payer: Self-pay | Admitting: Gastroenterology

## 2015-07-15 VITALS — BP 113/71 | HR 62 | Temp 97.0°F | Resp 27 | Ht 64.0 in | Wt 256.0 lb

## 2015-07-15 DIAGNOSIS — Z1211 Encounter for screening for malignant neoplasm of colon: Secondary | ICD-10-CM

## 2015-07-15 DIAGNOSIS — D125 Benign neoplasm of sigmoid colon: Secondary | ICD-10-CM | POA: Diagnosis not present

## 2015-07-15 MED ORDER — SODIUM CHLORIDE 0.9 % IV SOLN: 500.0000 mL | INTRAVENOUS | Status: DC

## 2015-07-15 NOTE — Patient Instructions (Signed)
YOU HAD AN ENDOSCOPIC PROCEDURE TODAY AT THE [facility]:   Refer to the procedure report that was given to you for any specific questions about what was found during the examination.  If the procedure report does not answer your questions, please call your gastroenterologist to clarify.  If you requested that your care partner not be given the details of your procedure findings, then the procedure report has been included in a sealed envelope for you to review at your convenience later.  YOU SHOULD EXPECT: Some feelings of bloating in the abdomen. Passage of more gas than usual.  Walking can help get rid of the air that was put into your GI tract during the procedure and reduce the bloating. If you had a lower endoscopy (such as a colonoscopy or flexible sigmoidoscopy) you may notice spotting of blood in your stool or on the toilet paper. If you underwent a bowel prep for your procedure, you may not have a normal bowel movement for a few days.  Please Note:  You might notice some irritation and congestion in your nose or some drainage.  This is from the oxygen used during your procedure.  There is no need for concern and it should clear up in a day or so.  SYMPTOMS TO REPORT IMMEDIATELY:   Following lower endoscopy (colonoscopy or flexible sigmoidoscopy):  Excessive amounts of blood in the stool  Significant tenderness or worsening of abdominal pains  Swelling of the abdomen that is new, acute  Fever of 100F or higher   For urgent or emergent issues, a gastroenterologist can be reached at any hour by calling [phone].   DIET: Your first meal following the procedure should be a small meal and then it is ok to progress to your normal diet. Heavy or fried foods are harder to digest and may make you feel nauseous or bloated.  Likewise, meals heavy in dairy and vegetables can increase bloating.  Drink plenty of fluids but you should avoid alcoholic beverages for 24  hours.  ACTIVITY:  You should plan to take it easy for the rest of today and you should NOT DRIVE or use heavy machinery until tomorrow (because of the sedation medicines used during the test).    FOLLOW UP: Our staff will call the number listed on your records the next business day following your procedure to check on you and address any questions or concerns that you may have regarding the information given to you following your procedure. If we do not reach you, we will leave a message.  However, if you are feeling well and you are not experiencing any problems, there is no need to return our call.  We will assume that you have returned to your regular daily activities without incident.  If any biopsies were taken you will be contacted by phone or by letter within the next 1-3 weeks.  Please call us at [phone] if you have not heard about the biopsies in 3 weeks.    SIGNATURES/CONFIDENTIALITY: You and/or your care partner have signed paperwork which will be entered into your electronic medical record.  These signatures attest to the fact that that the information above on your After Visit Summary has been reviewed and is understood.  Full responsibility of the confidentiality of this discharge information lies with you and/or your care-partner.  Read all of the handouts given to you by your recovery room nurse. 

## 2015-07-15 NOTE — Progress Notes (Signed)
Report to PACU, RN, vss, BBS= Clear.  

## 2015-07-15 NOTE — Progress Notes (Signed)
Called to room to assist during endoscopic procedure.  Patient ID and intended procedure confirmed with present staff. Received instructions for my participation in the procedure from the performing physician.  

## 2015-07-15 NOTE — Op Note (Signed)
[facility]  [address] [place], [address]   COLONOSCOPY PROCEDURE REPORT  PATIENT: Monica Zuniga, Monica Zuniga  MR#: [id number] BIRTHDATE: Aug 19, 1965 , 49  yrs. old GENDER: female ENDOSCOPIST: Ladene Artist, MD, [facility] REFERRED BY:  Carolann Littler, M.D. PROCEDURE DATE:  07/15/2015 PROCEDURE:   Colonoscopy, screening and Colonoscopy with biopsy First Screening Colonoscopy - Avg.  risk and is 50 yrs.  old or older Yes.  Prior Negative Screening - Now for repeat screening. N/A  History of Adenoma - Now for follow-up colonoscopy & has been > or = to 3 yrs.  N/A  Polyps removed today? Yes ASA CLASS:   Class II INDICATIONS:Screening for colonic neoplasia and Colorectal Neoplasm Risk Assessment for this procedure is average risk. MEDICATIONS: Monitored anesthesia care and Propofol 250 mg IV DESCRIPTION OF PROCEDURE:   After the risks benefits and alternatives of the procedure were thoroughly explained, informed consent was obtained.  The digital rectal exam revealed no abnormalities of the rectum.   The LB [id number]-[id number]  endoscope was introduced through the anus and advanced to the cecum, which was identified by both the appendix and ileocecal valve. No adverse events experienced.   The quality of the prep was excellent. (Suprep was used)  The instrument was then slowly withdrawn as the colon was fully examined. Estimated blood loss is zero unless otherwise noted in this procedure report.    COLON FINDINGS: A sessile polyp measuring 4 mm in size was found in the sigmoid colon.  A polypectomy was performed with cold forceps. The resection was complete, the polyp tissue was completely retrieved and sent to histology.   The colonic mucosa appeared normal at the splenic flexure, in the transverse colon, rectum, descending colon, at the ileocecal valve, cecum, hepatic flexure, and in the ascending colon.  Retroflexed views revealed no abnormalities. The time to cecum =  1.7 Withdrawal time = 8.9   The scope was withdrawn and the procedure completed. COMPLICATIONS: There were no immediate complications.  ENDOSCOPIC IMPRESSION: 1.   Sessile polyp in the sigmoid colon; polypectomy performed with cold forceps 2.   The colonic mucosa otherwise appeared normal  RECOMMENDATIONS: 1.  Await pathology results 2.  Repeat colonoscopy in 5 years if polyp adenomatous; otherwise 10 years  eSigned:  Ladene Artist, MD, [facility] 07/15/2015 11:14 AM

## 2015-07-16 ENCOUNTER — Telehealth: Payer: Self-pay | Admitting: *Deleted

## 2015-07-16 NOTE — Telephone Encounter (Signed)
Message left

## 2015-07-29 ENCOUNTER — Encounter: Payer: Self-pay | Admitting: Gastroenterology

## 2015-08-15 ENCOUNTER — Telehealth: Payer: Self-pay | Admitting: Family Medicine

## 2015-08-15 NOTE — Telephone Encounter (Signed)
ERROR

## 2015-08-16 ENCOUNTER — Encounter: Payer: Self-pay | Admitting: Family Medicine

## 2015-08-16 ENCOUNTER — Ambulatory Visit (INDEPENDENT_AMBULATORY_CARE_PROVIDER_SITE_OTHER): Payer: BLUE CROSS/BLUE SHIELD | Admitting: Family Medicine

## 2015-08-16 VITALS — BP 118/80 | HR 91 | Temp 97.9°F | Ht 64.0 in | Wt 260.0 lb

## 2015-08-16 DIAGNOSIS — R05 Cough: Secondary | ICD-10-CM | POA: Diagnosis not present

## 2015-08-16 DIAGNOSIS — R059 Cough, unspecified: Secondary | ICD-10-CM

## 2015-08-16 MED ORDER — BENZONATATE 200 MG PO CAPS: 200.0000 mg | ORAL_CAPSULE | Freq: Three times a day (TID) | ORAL | Status: DC | PRN

## 2015-08-16 MED ORDER — CIPROFLOXACIN HCL 500 MG PO TABS: ORAL_TABLET | ORAL | Status: DC

## 2015-08-16 NOTE — Patient Instructions (Signed)
Please be in touch if cough not improved over next two weeks.

## 2015-08-16 NOTE — Progress Notes (Signed)
Pre visit review using our clinic review tool, if applicable. No additional management support is needed unless otherwise documented below in the visit note. 

## 2015-08-16 NOTE — Progress Notes (Signed)
   Subjective:    Patient ID: Monica Zuniga, female    DOB: 02-07-1966, 50 y.o.   MRN: [id number]  HPI Acute visit for cough. Patient states about 2 months ago she developed some cough which was productive initially. She went to a local urgent care and was prescribed what she thinks was doxycycline. She felt slightly better but cough has never resolved. She has not had any dyspnea, fever, chills, hemoptysis, or any appetite or weight changes. No night sweats. No obvious wheezing. No GERD symptoms. Some postnasal drip symptoms. She still exercising without much difficulty. Cough is worse at night. Triggered by talking  Past Medical History  Diagnosis Date  . 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 Knee arthroscopy      left knee scope 2005, right 2008  . Joint replacement      L- replacement- 2011  . Wisdom tooth extraction      /w sedation  . Eye surgery      as a baby- surgery for eye problem   . 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 Total knee arthroplasty  06/27/2012    Procedure: TOTAL KNEE ARTHROPLASTY;  Surgeon: Kerin Salen, MD;  Location: [facility];  Service: Orthopedics;  Laterality: Right;    reports that she has never smoked. She has never used smokeless tobacco. She reports that she drinks about 2.4 - 3.6 oz of alcohol per week. She reports that she does not use illicit drugs. family history includes Arthritis in her other; Diabetes in her paternal grandmother. There is no history of Colon cancer. Allergies  Allergen Reactions  . Shellfish Allergy Other (See Comments)    Numbness       Review of Systems  Constitutional: Negative for fever, chills, appetite change and unexpected weight change.  HENT: Positive for postnasal drip. Negative for sore throat.   Respiratory: Positive for cough. Negative for shortness of breath and wheezing.   Neurological: Negative for headaches.       Objective:   Physical Exam  Constitutional: She appears well-developed and well-nourished. No distress.  HENT:  Right Ear: External ear normal.  Left Ear: External ear normal.  Mouth/Throat: Oropharynx is clear and moist.  Neck: Neck supple.  Cardiovascular: Normal rate and regular rhythm.   Pulmonary/Chest: Effort normal and breath sounds normal. No respiratory distress. She has no wheezes. She has no rales.  Lymphadenopathy:    She has no cervical adenopathy.          Assessment & Plan:  Cough. Suspect post viral bronchitis. She has non-focal exam and no worrisome symptoms. Given duration we have suggested she consider chest x-ray but she is getting ready to leave the country tomorrow and prefers to wait and see if she is better when she gets back. Tessalon Perles 200 mg every 8 hours as needed for cough. Follow-up promptly for any fever, dyspnea, or other concerns. She will touch base in one week of cough persisting

## 2016-05-01 ENCOUNTER — Encounter: Payer: BLUE CROSS/BLUE SHIELD | Admitting: Family Medicine

## 2016-05-05 ENCOUNTER — Ambulatory Visit (INDEPENDENT_AMBULATORY_CARE_PROVIDER_SITE_OTHER): Payer: BLUE CROSS/BLUE SHIELD | Admitting: Family Medicine

## 2016-05-05 ENCOUNTER — Encounter: Payer: Self-pay | Admitting: Family Medicine

## 2016-05-05 VITALS — BP 110/70 | HR 77 | Temp 97.8°F | Ht 64.0 in | Wt 258.0 lb

## 2016-05-05 DIAGNOSIS — Z23 Encounter for immunization: Secondary | ICD-10-CM

## 2016-05-05 DIAGNOSIS — Z0000 Encounter for general adult medical examination without abnormal findings: Secondary | ICD-10-CM | POA: Diagnosis not present

## 2016-05-05 DIAGNOSIS — K635 Polyp of colon: Secondary | ICD-10-CM

## 2016-05-05 LAB — BASIC METABOLIC PANEL
BUN: 13 mg/dL (ref 6–23)
CALCIUM: 9.3 mg/dL (ref 8.4–10.5)
CHLORIDE: 103 meq/L (ref 96–112)
CO2: 26 mEq/L (ref 19–32)
CREATININE: 0.7 mg/dL (ref 0.40–1.20)
GFR: 93.62 mL/min (ref >60.00)
Glucose, Bld: 102 mg/dL — ABNORMAL HIGH (ref 70–99)
Potassium: 4.4 mEq/L (ref 3.5–5.1)
Sodium: 139 mEq/L (ref 135–145)

## 2016-05-05 LAB — CBC WITH DIFFERENTIAL/PLATELET
BASOS ABS: 0 10*3/uL (ref 0.0–0.1)
BASOS PCT: 0.6 % (ref 0.0–3.0)
EOS ABS: 0.1 10*3/uL (ref 0.0–0.7)
Eosinophils Relative: 1.9 % (ref 0.0–5.0)
HCT: 43.3 % (ref 36.0–46.0)
HEMOGLOBIN: 14.5 g/dL (ref 12.0–15.0)
Lymphocytes Relative: 32.5 % (ref 12.0–46.0)
Lymphs Abs: 2.3 10*3/uL (ref 0.7–4.0)
MCHC: 33.5 g/dL (ref 30.0–36.0)
MCV: 87.2 fl (ref 78.0–100.0)
MONO ABS: 0.5 10*3/uL (ref 0.1–1.0)
Monocytes Relative: 6.5 % (ref 3.0–12.0)
Neutro Abs: 4.1 10*3/uL (ref 1.4–7.7)
Neutrophils Relative %: 58.5 % (ref 43.0–77.0)
PLATELETS: 214 10*3/uL (ref 150.0–400.0)
RBC: 4.97 Mil/uL (ref 3.87–5.11)
RDW: 14 % (ref 11.5–15.5)
WBC: 7 10*3/uL (ref 4.0–10.5)

## 2016-05-05 LAB — LIPID PANEL
CHOLESTEROL: 200 mg/dL (ref 0–200)
HDL: 65.7 mg/dL (ref >39.00)
LDL CALC: 104 mg/dL — AB (ref 0–99)
NonHDL: 134.08
TRIGLYCERIDES: 151 mg/dL — AB (ref 0.0–149.0)
Total CHOL/HDL Ratio: 3
VLDL: 30.2 mg/dL (ref 0.0–40.0)

## 2016-05-05 LAB — HEPATIC FUNCTION PANEL
ALT: 27 U/L (ref 0–35)
AST: 21 U/L (ref 0–37)
Albumin: 4.1 g/dL (ref 3.5–5.2)
Alkaline Phosphatase: 94 U/L (ref 39–117)
BILIRUBIN DIRECT: 0.1 mg/dL (ref 0.0–0.3)
BILIRUBIN TOTAL: 0.5 mg/dL (ref 0.2–1.2)
Total Protein: 6.7 g/dL (ref 6.0–8.3)

## 2016-05-05 LAB — TSH: TSH: 3.98 u[IU]/mL (ref 0.35–4.50)

## 2016-05-05 MED ORDER — PHENTERMINE-TOPIRAMATE ER 3.75-23 MG PO CP24: 1.0000 | ORAL_CAPSULE | Freq: Every day | ORAL | 0 refills | Status: DC

## 2016-05-05 MED ORDER — PHENTERMINE-TOPIRAMATE ER 7.5-46 MG PO CP24: 1.0000 | ORAL_CAPSULE | Freq: Every day | ORAL | 2 refills | Status: DC

## 2016-05-05 NOTE — Progress Notes (Signed)
Pre visit review using our clinic review tool, if applicable. No additional management support is needed unless otherwise documented below in the visit note. 

## 2016-05-05 NOTE — Progress Notes (Signed)
Subjective:     Patient ID: Monica Zuniga, female   DOB: [birth year], 50 y.o.   MRN: [id number]  HPI Patient seen for physical. She sees gynecologist yearly and also dermatologist yearly. Her biggest concern has been difficulty losing weight. Her gynecologist last year placed her on Qsymia and she does recall losing about 15-20 pounds after stopping this gain weight back. She is postmenopausal. She's not tried any specific diet plans other than has tried calorie restriction. She does drink about one glass of wine daily and realizes she needs to scale back back. She is exercising about 4 times per week. She is getting regular Pap smears and mammograms through gynecologist. She had colonoscopy last year with 1 hyperplastic polyp sigmoid colon. Recommended 10 year follow-up.  Past Medical History:  Diagnosis Date  . 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 EYE SURGERY     as a baby- surgery for eye problem   . JOINT REPLACEMENT     L- replacement- 2011  . KNEE ARTHROSCOPY     left knee scope 2005, right 2008  . TOTAL KNEE ARTHROPLASTY  06/2012   RIGHT KNEE  . TOTAL KNEE ARTHROPLASTY  06/27/2012   Procedure: TOTAL KNEE ARTHROPLASTY;  Surgeon: Kerin Salen, MD;  Location: [facility];  Service: Orthopedics;  Laterality: Right;  . WISDOM TOOTH EXTRACTION     /w sedation    reports that she has never smoked. She has never used smokeless tobacco. She reports that she drinks about 2.4 - 3.6 oz of alcohol per week . She reports that she does not use drugs. family history includes Arthritis in her other; Diabetes in her paternal grandmother. Allergies  Allergen Reactions  . Shellfish Allergy Other (See Comments)    Numbness      Review of Systems  Constitutional: Negative for activity change, appetite change, fatigue, fever and unexpected weight change.  HENT: Negative for ear pain, hearing loss, sore throat and trouble swallowing.    Eyes: Negative for visual disturbance.  Respiratory: Negative for cough and shortness of breath.   Cardiovascular: Negative for chest pain and palpitations.  Gastrointestinal: Negative for abdominal pain, blood in stool, constipation and diarrhea.  Endocrine: Negative for polydipsia and polyuria.  Genitourinary: Negative for dysuria and hematuria.  Musculoskeletal: Negative for arthralgias, back pain and myalgias.  Skin: Negative for rash.  Neurological: Negative for dizziness, syncope and headaches.  Hematological: Negative for adenopathy.  Psychiatric/Behavioral: Negative for confusion and dysphoric mood.       Objective:   Physical Exam  Constitutional: She is oriented to person, place, and time. She appears well-developed and well-nourished.  HENT:  Head: Normocephalic and atraumatic.  Eyes: EOM are normal. Pupils are equal, round, and reactive to light.  Neck: Normal range of motion. Neck supple. No thyromegaly present.  Cardiovascular: Normal rate, regular rhythm and normal heart sounds.   No murmur heard. Pulmonary/Chest: Breath sounds normal. No respiratory distress. She has no wheezes. She has no rales.  Abdominal: Soft. Bowel sounds are normal. She exhibits no distension and no mass. There is no tenderness. There is no rebound and no guarding.  Genitourinary:  Genitourinary Comments: Per GYN  Musculoskeletal: Normal range of motion. She exhibits no edema.  Lymphadenopathy:    She has no cervical adenopathy.  Neurological: She is alert and oriented to person, place, and time. She displays normal reflexes. No cranial nerve deficit.  Skin: No rash noted.  Psychiatric: She has  a normal mood and affect. Her behavior is normal. Judgment and thought content normal.       Assessment:     Physical exam. We discussed strategies for managing her obesity.    Plan:     -Continue with GYN follow-up. She will also plan to continue see dermatologist once yearly for skin  checks -Obtain screening lab work -Discussed weight loss strategies in some detail. She wishes to go back on Qsymia- We'll start 3.75 mg once daily for 2 weeks then increase to 7.5 mg and 3 month follow-up to reassess  Eulas Post MD [facility] at [facility]

## 2016-08-07 ENCOUNTER — Ambulatory Visit (INDEPENDENT_AMBULATORY_CARE_PROVIDER_SITE_OTHER): Payer: BLUE CROSS/BLUE SHIELD | Admitting: Family Medicine

## 2016-08-07 VITALS — BP 110/78 | HR 105 | Temp 98.0°F | Ht 64.0 in | Wt 263.0 lb

## 2016-08-07 DIAGNOSIS — R059 Cough, unspecified: Secondary | ICD-10-CM

## 2016-08-07 DIAGNOSIS — R05 Cough: Secondary | ICD-10-CM | POA: Diagnosis not present

## 2016-08-07 MED ORDER — BENZONATATE 100 MG PO CAPS: 100.0000 mg | ORAL_CAPSULE | Freq: Three times a day (TID) | ORAL | 1 refills | Status: DC | PRN

## 2016-08-07 NOTE — Progress Notes (Signed)
Pre visit review using our clinic review tool, if applicable. No additional management support is needed unless otherwise documented below in the visit note. 

## 2016-08-07 NOTE — Patient Instructions (Signed)
Follow up for any fever or increased shortness of breath. 

## 2016-08-07 NOTE — Progress Notes (Signed)
Subjective:     Patient ID: Monica Zuniga, female   DOB: Feb 06, 1966, 51 y.o.   MRN: [id number]  HPI Patient seen for follow-up regarding cough. She states around December 10th she noticed some cough and nasal congestion. Took Mucinex and cough did not improve much. She has several trips including a cruise and a trip to visit her parents in [place] and then subsequent trip in early January to [place] to visit her in-laws. She was there January 3 and went to urgent care because her cough was becoming more severe. She did not have fever at any point. She was seen and prescribed steroids for 5 days along with Monica Zuniga and pro-air inhaler. She felt the steroids helped somewhat. She also feels Monica Zuniga is helping. Her cough is worse at night. She denies any sinus pain or postnasal drip symptoms. No fever. No hemoptysis. No appetite or weight changes. Patient is nonsmoker. No GERD symptoms.  Past Medical History:  Diagnosis Date  . 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 EYE SURGERY     as a baby- surgery for eye problem   . JOINT REPLACEMENT     L- replacement- 2011  . KNEE ARTHROSCOPY     left knee scope 2005, right 2008  . TOTAL KNEE ARTHROPLASTY  06/2012   RIGHT KNEE  . TOTAL KNEE ARTHROPLASTY  06/27/2012   Procedure: TOTAL KNEE ARTHROPLASTY;  Surgeon: Kerin Salen, MD;  Location: [facility];  Service: Orthopedics;  Laterality: Right;  . WISDOM TOOTH EXTRACTION     /w sedation    reports that she has never smoked. She has never used smokeless tobacco. She reports that she drinks about 2.4 - 3.6 oz of alcohol per week . She reports that she does not use drugs. family history includes Arthritis in her other; Diabetes in her paternal grandmother. Allergies  Allergen Reactions  . Shellfish Allergy Other (See Comments)    Numbness      Review of Systems  Constitutional: Negative for chills, fatigue, fever and unexpected  weight change.  HENT: Negative for postnasal drip and sinus pain.   Respiratory: Positive for cough. Negative for shortness of breath and wheezing.   Neurological: Negative for headaches.       Objective:   Physical Exam  Constitutional: She appears well-developed and well-nourished. No distress.  HENT:  Right Ear: External ear normal.  Left Ear: External ear normal.  Mouth/Throat: Oropharynx is clear and moist.  Neck: Neck supple.  Cardiovascular: Normal rate and regular rhythm.   Pulmonary/Chest: Effort normal and breath sounds normal. No respiratory distress. She has no wheezes. She has no rales.  Lymphadenopathy:    She has no cervical adenopathy.       Assessment:     Cough. Suspect resolving viral bronchitis. Nonfocal exam    Plan:     -Refill Tessalon 100 mg every 8 hours as needed for cough -Follow-up immediately for any fever, increasing shortness of breath, or other concern -Touch base if cough not resolving in 2-3 weeks. If not better by that point, consider chest x-ray to further evaluate  Eulas Post MD [facility] at [facility]

## 2016-11-10 DIAGNOSIS — R0982 Postnasal drip: Secondary | ICD-10-CM | POA: Insufficient documentation

## 2016-11-10 DIAGNOSIS — K219 Gastro-esophageal reflux disease without esophagitis: Secondary | ICD-10-CM | POA: Insufficient documentation

## 2016-11-10 DIAGNOSIS — R0683 Snoring: Secondary | ICD-10-CM | POA: Insufficient documentation

## 2016-11-10 DIAGNOSIS — T7840XA Allergy, unspecified, initial encounter: Secondary | ICD-10-CM | POA: Insufficient documentation

## 2017-06-22 ENCOUNTER — Encounter: Payer: Self-pay | Admitting: Family Medicine

## 2017-06-22 ENCOUNTER — Ambulatory Visit (INDEPENDENT_AMBULATORY_CARE_PROVIDER_SITE_OTHER): Payer: BLUE CROSS/BLUE SHIELD | Admitting: Family Medicine

## 2017-06-22 VITALS — BP 110/80 | HR 76 | Temp 97.7°F | Ht 64.0 in | Wt 251.5 lb

## 2017-06-22 DIAGNOSIS — Z0000 Encounter for general adult medical examination without abnormal findings: Secondary | ICD-10-CM

## 2017-06-22 DIAGNOSIS — Z23 Encounter for immunization: Secondary | ICD-10-CM

## 2017-06-22 LAB — CBC WITH DIFFERENTIAL/PLATELET
BASOS PCT: 0.6 % (ref 0.0–3.0)
Basophils Absolute: 0 10*3/uL (ref 0.0–0.1)
EOS ABS: 0.1 10*3/uL (ref 0.0–0.7)
EOS PCT: 1.5 % (ref 0.0–5.0)
HCT: 42.9 % (ref 36.0–46.0)
Hemoglobin: 14.3 g/dL (ref 12.0–15.0)
LYMPHS ABS: 2.6 10*3/uL (ref 0.7–4.0)
Lymphocytes Relative: 39.4 % (ref 12.0–46.0)
MCHC: 33.4 g/dL (ref 30.0–36.0)
MCV: 88.6 fl (ref 78.0–100.0)
MONO ABS: 0.5 10*3/uL (ref 0.1–1.0)
Monocytes Relative: 6.9 % (ref 3.0–12.0)
NEUTROS ABS: 3.4 10*3/uL (ref 1.4–7.7)
NEUTROS PCT: 51.6 % (ref 43.0–77.0)
PLATELETS: 232 10*3/uL (ref 150.0–400.0)
RBC: 4.84 Mil/uL (ref 3.87–5.11)
RDW: 14 % (ref 11.5–15.5)
WBC: 6.6 10*3/uL (ref 4.0–10.5)

## 2017-06-22 LAB — HEPATIC FUNCTION PANEL
ALBUMIN: 4.3 g/dL (ref 3.5–5.2)
ALT: 22 U/L (ref 0–35)
AST: 18 U/L (ref 0–37)
Alkaline Phosphatase: 99 U/L (ref 39–117)
BILIRUBIN TOTAL: 0.4 mg/dL (ref 0.2–1.2)
Bilirubin, Direct: 0.1 mg/dL (ref 0.0–0.3)
Total Protein: 6.8 g/dL (ref 6.0–8.3)

## 2017-06-22 LAB — BASIC METABOLIC PANEL
BUN: 18 mg/dL (ref 6–23)
CHLORIDE: 101 meq/L (ref 96–112)
CO2: 29 mEq/L (ref 19–32)
Calcium: 9.7 mg/dL (ref 8.4–10.5)
Creatinine, Ser: 0.71 mg/dL (ref 0.40–1.20)
GFR: 91.7 mL/min (ref >60.00)
Glucose, Bld: 93 mg/dL (ref 70–99)
POTASSIUM: 4.4 meq/L (ref 3.5–5.1)
SODIUM: 140 meq/L (ref 135–145)

## 2017-06-22 LAB — LIPID PANEL
CHOLESTEROL: 186 mg/dL (ref 0–200)
HDL: 65.8 mg/dL (ref >39.00)
LDL CALC: 103 mg/dL — AB (ref 0–99)
NonHDL: 120.24
TRIGLYCERIDES: 84 mg/dL (ref 0.0–149.0)
Total CHOL/HDL Ratio: 3
VLDL: 16.8 mg/dL (ref 0.0–40.0)

## 2017-06-22 LAB — TSH: TSH: 3.96 u[IU]/mL (ref 0.35–4.50)

## 2017-06-22 NOTE — Progress Notes (Signed)
Subjective:     Patient ID: Monica Zuniga, female   DOB: 12-09-1965, 51 y.o.   MRN: [id number]  HPI Patient seen for physical exam. She sees her gynecologist yearly. She exercises regularly. No history of shingles vaccine. Needs flu vaccine. Never smoked. Colonoscopy up-to-date. She had tetanus booster this past summer.  Family history reviewed. Her mom was just diagnosed with Alzheimer's disease this past year at age 43. Her father is 64 and doing fairly well  Past Medical History:  Diagnosis Date  . 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 EYE SURGERY     as a baby- surgery for eye problem   . JOINT REPLACEMENT     L- replacement- 2011  . KNEE ARTHROSCOPY     left knee scope 2005, right 2008  . TOTAL KNEE ARTHROPLASTY  06/2012   RIGHT KNEE  . TOTAL KNEE ARTHROPLASTY  06/27/2012   Procedure: TOTAL KNEE ARTHROPLASTY;  Surgeon: Kerin Salen, MD;  Location: [facility];  Service: Orthopedics;  Laterality: Right;  . WISDOM TOOTH EXTRACTION     /w sedation    reports that  has never smoked. she has never used smokeless tobacco. She reports that she drinks about 2.4 - 3.6 oz of alcohol per week. She reports that she does not use drugs. family history includes Arthritis in her other; Diabetes in her paternal grandmother. Allergies  Allergen Reactions  . Shellfish Allergy Other (See Comments)    Numbness      Review of Systems  Constitutional: Negative for activity change, appetite change, fatigue, fever and unexpected weight change.  HENT: Negative for ear pain, hearing loss, sore throat and trouble swallowing.   Eyes: Negative for visual disturbance.  Respiratory: Negative for cough and shortness of breath.   Cardiovascular: Negative for chest pain and palpitations.  Gastrointestinal: Negative for abdominal pain, blood in stool, constipation and diarrhea.  Genitourinary: Negative for dysuria and hematuria.  Musculoskeletal:  Negative for arthralgias, back pain and myalgias.  Skin: Negative for rash.  Neurological: Negative for dizziness, syncope and headaches.  Hematological: Negative for adenopathy.  Psychiatric/Behavioral: Negative for confusion and dysphoric mood.       Objective:   Physical Exam  Constitutional: She is oriented to person, place, and time. She appears well-developed and well-nourished.  HENT:  Head: Normocephalic and atraumatic.  Eyes: EOM are normal. Pupils are equal, round, and reactive to light.  Neck: Normal range of motion. Neck supple. No thyromegaly present.  Cardiovascular: Normal rate, regular rhythm and normal heart sounds.  No murmur heard. Pulmonary/Chest: Breath sounds normal. No respiratory distress. She has no wheezes. She has no rales.  Abdominal: Soft. Bowel sounds are normal. She exhibits no distension and no mass. There is no tenderness. There is no rebound and no guarding.  Musculoskeletal: Normal range of motion. She exhibits no edema.  Lymphadenopathy:    She has no cervical adenopathy.  Neurological: She is alert and oriented to person, place, and time. She displays normal reflexes. No cranial nerve deficit.  Skin: No rash noted.  Psychiatric: She has a normal mood and affect. Her behavior is normal. Judgment and thought content normal.       Assessment:     Physical exam. The following issues were addressed today    Plan:     -Flu vaccine given -Obtain screening lab work -Discussed shingles vaccine and she will check on insurance coverage and let us know if interested -She will  continue with regular GYN follow-up and also sees dermatologist once a year for skin cancer screening  Eulas Post MD [facility] at [facility]

## 2017-06-22 NOTE — Patient Instructions (Signed)
Check on coverage for new shingles vaccine (Shingrix) and let us know if interested. 

## 2017-07-01 DIAGNOSIS — M9908 Segmental and somatic dysfunction of rib cage: Secondary | ICD-10-CM | POA: Diagnosis not present

## 2017-07-01 DIAGNOSIS — M9902 Segmental and somatic dysfunction of thoracic region: Secondary | ICD-10-CM | POA: Diagnosis not present

## 2017-07-01 DIAGNOSIS — M531 Cervicobrachial syndrome: Secondary | ICD-10-CM | POA: Diagnosis not present

## 2017-07-01 DIAGNOSIS — M9901 Segmental and somatic dysfunction of cervical region: Secondary | ICD-10-CM | POA: Diagnosis not present

## 2017-08-12 DIAGNOSIS — M9902 Segmental and somatic dysfunction of thoracic region: Secondary | ICD-10-CM | POA: Diagnosis not present

## 2017-08-12 DIAGNOSIS — M9901 Segmental and somatic dysfunction of cervical region: Secondary | ICD-10-CM | POA: Diagnosis not present

## 2017-08-12 DIAGNOSIS — M9908 Segmental and somatic dysfunction of rib cage: Secondary | ICD-10-CM | POA: Diagnosis not present

## 2017-08-12 DIAGNOSIS — M531 Cervicobrachial syndrome: Secondary | ICD-10-CM | POA: Diagnosis not present

## 2017-08-25 DIAGNOSIS — D1801 Hemangioma of skin and subcutaneous tissue: Secondary | ICD-10-CM | POA: Diagnosis not present

## 2017-08-25 DIAGNOSIS — D225 Melanocytic nevi of trunk: Secondary | ICD-10-CM | POA: Diagnosis not present

## 2017-08-25 DIAGNOSIS — L821 Other seborrheic keratosis: Secondary | ICD-10-CM | POA: Diagnosis not present

## 2017-08-25 DIAGNOSIS — L814 Other melanin hyperpigmentation: Secondary | ICD-10-CM | POA: Diagnosis not present

## 2017-09-27 DIAGNOSIS — M9908 Segmental and somatic dysfunction of rib cage: Secondary | ICD-10-CM | POA: Diagnosis not present

## 2017-09-27 DIAGNOSIS — M531 Cervicobrachial syndrome: Secondary | ICD-10-CM | POA: Diagnosis not present

## 2017-09-27 DIAGNOSIS — M9902 Segmental and somatic dysfunction of thoracic region: Secondary | ICD-10-CM | POA: Diagnosis not present

## 2017-09-27 DIAGNOSIS — M9901 Segmental and somatic dysfunction of cervical region: Secondary | ICD-10-CM | POA: Diagnosis not present

## 2017-11-18 DIAGNOSIS — M9908 Segmental and somatic dysfunction of rib cage: Secondary | ICD-10-CM | POA: Diagnosis not present

## 2017-11-18 DIAGNOSIS — M9902 Segmental and somatic dysfunction of thoracic region: Secondary | ICD-10-CM | POA: Diagnosis not present

## 2017-11-18 DIAGNOSIS — M9901 Segmental and somatic dysfunction of cervical region: Secondary | ICD-10-CM | POA: Diagnosis not present

## 2017-11-18 DIAGNOSIS — M531 Cervicobrachial syndrome: Secondary | ICD-10-CM | POA: Diagnosis not present

## 2018-01-06 DIAGNOSIS — M9902 Segmental and somatic dysfunction of thoracic region: Secondary | ICD-10-CM | POA: Diagnosis not present

## 2018-01-06 DIAGNOSIS — M9908 Segmental and somatic dysfunction of rib cage: Secondary | ICD-10-CM | POA: Diagnosis not present

## 2018-01-06 DIAGNOSIS — M531 Cervicobrachial syndrome: Secondary | ICD-10-CM | POA: Diagnosis not present

## 2018-01-06 DIAGNOSIS — M9901 Segmental and somatic dysfunction of cervical region: Secondary | ICD-10-CM | POA: Diagnosis not present

## 2018-01-14 ENCOUNTER — Ambulatory Visit: Payer: Self-pay | Admitting: Family Medicine

## 2018-01-14 NOTE — Telephone Encounter (Signed)
Telephone call from client with complaint of right lower leg swelling.  State it disappears over night.  Not sure if it is water retention or not.  Has a history of trauma on the right leg  Was on crutches. States she was on crutches . Question of arthritis in ankle area.  Had bilateral knee replacement.  Client feels there is scar tissue there. Client states there has always been swelling. Notices that its more often. Swelling is in the lower calf area near the foot.  Client rates the severity moderate. Denies any pain nor redness.  No fever. Denies heart failure, kidney disease and Iiver failure.  Denies chest pain and the chance of being pregnant.   Appointment scheduled for Monday.  6/ 24/19.     Reason for Disposition . [1] MILD swelling of both ankles (i.e., pedal edema) AND [2] new onset or worsening  Answer Assessment - Initial Assessment Questions 1. ONSET: "When did the swelling start?" (e.g., minutes, hours, days)     *No Answer*few months 2. LOCATION: "What part of the leg is swollen?"  "Are both legs swollen or just one leg?"      Lower calf foot 3. SEVERITY: "How bad is the swelling?" (e.g., localized; mild, moderate, severe)  - Localized - small area of swelling localized to one leg  - MILD pedal edema - swelling limited to foot and ankle, pitting edema < 1/4 inch (6 mm) deep, rest and elevation eliminate most or all swelling  - MODERATE edema - swelling of lower leg to knee, pitting edema > 1/4 inch (6 mm) deep, rest and elevation only partially reduce swelling  - SEVERE edema - swelling extends above knee, facial or hand swelling present      moderate 4. REDNESS: "Does the swelling look red or infected?"     no 5. PAIN: "Is the swelling painful to touch?" If so, ask: "How painful is it?"   (Scale 1-10; mild, moderate or severe)     No pain 6. FEVER: "Do you have a fever?" If so, ask: "What is it, how was it measured, and when did it start?"      no 7. CAUSE: "What do you think  is causing the leg swelling?"     hx 8. MEDICAL HISTORY: "Do you have a history of heart failure, kidney disease, liver failure, or cancer?"     no 9. RECURRENT SYMPTOM: "Have you had leg swelling before?" If so, ask: "When was the last time?" "What happened that time?"    everday 10. OTHER SYMPTOMS: "Do you have any other symptoms?" (e.g., chest pain, difficulty breathing)      no 11. PREGNANCY: "Is there any chance you are pregnant?" "When was your last menstrual period?"     no  Protocols used: LEG SWELLING AND EDEMA-A-AH

## 2018-01-17 ENCOUNTER — Encounter: Payer: Self-pay | Admitting: Family Medicine

## 2018-01-17 ENCOUNTER — Ambulatory Visit (INDEPENDENT_AMBULATORY_CARE_PROVIDER_SITE_OTHER): Payer: BLUE CROSS/BLUE SHIELD | Admitting: Family Medicine

## 2018-01-17 VITALS — BP 110/78 | HR 79 | Temp 98.3°F | Wt 249.2 lb

## 2018-01-17 DIAGNOSIS — R600 Localized edema: Secondary | ICD-10-CM | POA: Diagnosis not present

## 2018-01-17 DIAGNOSIS — I872 Venous insufficiency (chronic) (peripheral): Secondary | ICD-10-CM | POA: Diagnosis not present
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
## 2018-01-17 NOTE — Patient Instructions (Signed)

## 2018-02-24 DIAGNOSIS — M531 Cervicobrachial syndrome: Secondary | ICD-10-CM | POA: Diagnosis not present

## 2018-02-24 DIAGNOSIS — M9901 Segmental and somatic dysfunction of cervical region: Secondary | ICD-10-CM | POA: Diagnosis not present

## 2018-02-24 DIAGNOSIS — M9902 Segmental and somatic dysfunction of thoracic region: Secondary | ICD-10-CM | POA: Diagnosis not present

## 2018-02-24 DIAGNOSIS — M9908 Segmental and somatic dysfunction of rib cage: Secondary | ICD-10-CM | POA: Diagnosis not present

## 2018-03-30 DIAGNOSIS — M531 Cervicobrachial syndrome: Secondary | ICD-10-CM | POA: Diagnosis not present

## 2018-03-30 DIAGNOSIS — M9901 Segmental and somatic dysfunction of cervical region: Secondary | ICD-10-CM | POA: Diagnosis not present

## 2018-03-30 DIAGNOSIS — M9902 Segmental and somatic dysfunction of thoracic region: Secondary | ICD-10-CM | POA: Diagnosis not present

## 2018-03-30 DIAGNOSIS — M9908 Segmental and somatic dysfunction of rib cage: Secondary | ICD-10-CM | POA: Diagnosis not present

## 2018-04-11 DIAGNOSIS — Z1231 Encounter for screening mammogram for malignant neoplasm of breast: Secondary | ICD-10-CM | POA: Diagnosis not present

## 2018-04-11 DIAGNOSIS — Z01419 Encounter for gynecological examination (general) (routine) without abnormal findings: Secondary | ICD-10-CM | POA: Diagnosis not present

## 2018-04-11 DIAGNOSIS — Z6841 Body Mass Index (BMI) 40.0 and over, adult: Secondary | ICD-10-CM | POA: Diagnosis not present

## 2018-04-11 DIAGNOSIS — Z1151 Encounter for screening for human papillomavirus (HPV): Secondary | ICD-10-CM | POA: Diagnosis not present

## 2018-05-11 DIAGNOSIS — M531 Cervicobrachial syndrome: Secondary | ICD-10-CM | POA: Diagnosis not present

## 2018-05-11 DIAGNOSIS — M9908 Segmental and somatic dysfunction of rib cage: Secondary | ICD-10-CM | POA: Diagnosis not present

## 2018-05-11 DIAGNOSIS — M9902 Segmental and somatic dysfunction of thoracic region: Secondary | ICD-10-CM | POA: Diagnosis not present

## 2018-05-11 DIAGNOSIS — M9901 Segmental and somatic dysfunction of cervical region: Secondary | ICD-10-CM | POA: Diagnosis not present

## 2018-06-15 DIAGNOSIS — M9902 Segmental and somatic dysfunction of thoracic region: Secondary | ICD-10-CM | POA: Diagnosis not present

## 2018-06-15 DIAGNOSIS — M9908 Segmental and somatic dysfunction of rib cage: Secondary | ICD-10-CM | POA: Diagnosis not present

## 2018-06-15 DIAGNOSIS — M531 Cervicobrachial syndrome: Secondary | ICD-10-CM | POA: Diagnosis not present

## 2018-06-15 DIAGNOSIS — M9901 Segmental and somatic dysfunction of cervical region: Secondary | ICD-10-CM | POA: Diagnosis not present

## 2018-06-20 DIAGNOSIS — H00012 Hordeolum externum right lower eyelid: Secondary | ICD-10-CM | POA: Diagnosis not present

## 2018-06-27 ENCOUNTER — Other Ambulatory Visit: Payer: Self-pay

## 2018-06-27 ENCOUNTER — Ambulatory Visit (INDEPENDENT_AMBULATORY_CARE_PROVIDER_SITE_OTHER): Payer: BLUE CROSS/BLUE SHIELD | Admitting: Family Medicine

## 2018-06-27 ENCOUNTER — Encounter: Payer: Self-pay | Admitting: Family Medicine

## 2018-06-27 VITALS — BP 112/70 | HR 73 | Temp 97.7°F | Ht 62.25 in | Wt 251.3 lb

## 2018-06-27 DIAGNOSIS — Z0000 Encounter for general adult medical examination without abnormal findings: Secondary | ICD-10-CM | POA: Diagnosis not present

## 2018-06-27 DIAGNOSIS — Z23 Encounter for immunization: Secondary | ICD-10-CM

## 2018-06-27 LAB — CBC WITH DIFFERENTIAL/PLATELET
BASOS ABS: 0 10*3/uL (ref 0.0–0.1)
Basophils Relative: 0.6 % (ref 0.0–3.0)
Eosinophils Absolute: 0.1 10*3/uL (ref 0.0–0.7)
Eosinophils Relative: 1.7 % (ref 0.0–5.0)
HCT: 44.3 % (ref 36.0–46.0)
HEMOGLOBIN: 14.9 g/dL (ref 12.0–15.0)
LYMPHS ABS: 2 10*3/uL (ref 0.7–4.0)
Lymphocytes Relative: 37.1 % (ref 12.0–46.0)
MCHC: 33.7 g/dL (ref 30.0–36.0)
MCV: 88.9 fl (ref 78.0–100.0)
MONO ABS: 0.4 10*3/uL (ref 0.1–1.0)
MONOS PCT: 7.3 % (ref 3.0–12.0)
NEUTROS PCT: 53.3 % (ref 43.0–77.0)
Neutro Abs: 2.9 10*3/uL (ref 1.4–7.7)
Platelets: 199 10*3/uL (ref 150.0–400.0)
RBC: 4.98 Mil/uL (ref 3.87–5.11)
RDW: 14.1 % (ref 11.5–15.5)
WBC: 5.4 10*3/uL (ref 4.0–10.5)

## 2018-06-27 LAB — LIPID PANEL
CHOL/HDL RATIO: 3
Cholesterol: 200 mg/dL (ref 0–200)
HDL: 68.8 mg/dL (ref >39.00)
LDL Cholesterol: 110 mg/dL — ABNORMAL HIGH (ref 0–99)
NONHDL: 131.36
Triglycerides: 105 mg/dL (ref 0.0–149.0)
VLDL: 21 mg/dL (ref 0.0–40.0)

## 2018-06-27 LAB — BASIC METABOLIC PANEL
BUN: 22 mg/dL (ref 6–23)
CO2: 30 meq/L (ref 19–32)
Calcium: 9.7 mg/dL (ref 8.4–10.5)
Chloride: 101 mEq/L (ref 96–112)
Creatinine, Ser: 0.79 mg/dL (ref 0.40–1.20)
GFR: 80.75 mL/min (ref >60.00)
GLUCOSE: 111 mg/dL — AB (ref 70–99)
Potassium: 4.6 mEq/L (ref 3.5–5.1)
SODIUM: 140 meq/L (ref 135–145)

## 2018-06-27 LAB — HEPATIC FUNCTION PANEL
ALT: 23 U/L (ref 0–35)
AST: 18 U/L (ref 0–37)
Albumin: 4.5 g/dL (ref 3.5–5.2)
Alkaline Phosphatase: 87 U/L (ref 39–117)
Bilirubin, Direct: 0.1 mg/dL (ref 0.0–0.3)
TOTAL PROTEIN: 6.9 g/dL (ref 6.0–8.3)
Total Bilirubin: 0.6 mg/dL (ref 0.2–1.2)

## 2018-06-27 LAB — TSH: TSH: 4.24 u[IU]/mL (ref 0.35–4.50)

## 2018-06-27 NOTE — Addendum Note (Signed)
Addended by: Anibal Henderson on: 06/27/2018 09:58 AM   Modules accepted: Orders

## 2018-06-27 NOTE — Patient Instructions (Signed)
Remember to get second shingles vaccine in 2 to 6 months.

## 2018-06-27 NOTE — Progress Notes (Signed)
  Subjective:     Patient ID: Monica Zuniga, female   DOB: 1965-11-15, 52 y.o.   MRN: [id number]  HPI Patient seen for physical exam.  She sees gynecologist regularly for Pap smears and mammograms.  She has just started a weight control/weight loss program called "[employer]."  She has already had flu vaccine and tetanus is up-to-date.  Colonoscopy 2016 with recommended 10-year follow-up.  She had mammogram in September.  She is had previous bilateral knee replacements.  She exercises fairly regularly.  Family history reviewed.  Her mother has Alzheimer's dementia.  Father's age 53  Past Medical History:  Diagnosis Date  . 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 EYE SURGERY     as a baby- surgery for eye problem   . JOINT REPLACEMENT     L- replacement- 2011  . KNEE ARTHROSCOPY     left knee scope 2005, right 2008  . TOTAL KNEE ARTHROPLASTY  06/2012   RIGHT KNEE  . TOTAL KNEE ARTHROPLASTY  06/27/2012   Procedure: TOTAL KNEE ARTHROPLASTY;  Surgeon: Kerin Salen, MD;  Location: [place];  Service: Orthopedics;  Laterality: Right;  . WISDOM TOOTH EXTRACTION     /w sedation    reports that she has never smoked. She has never used smokeless tobacco. She reports that she drinks about 4.0 - 6.0 standard drinks of alcohol per week. She reports that she does not use drugs. family history includes Arthritis in her other; Diabetes in her paternal grandmother. Allergies  Allergen Reactions  . Shellfish Allergy Other (See Comments)    Numbness       Review of Systems  Constitutional: Negative for activity change, appetite change, fatigue, fever and unexpected weight change.  HENT: Negative for ear pain, hearing loss, sore throat and trouble swallowing.   Eyes: Negative for visual disturbance.  Respiratory: Negative for cough and shortness of breath.   Cardiovascular: Negative for chest pain and palpitations.  Gastrointestinal:  Negative for abdominal pain, blood in stool, constipation and diarrhea.  Genitourinary: Negative for dysuria and hematuria.  Musculoskeletal: Negative for arthralgias, back pain and myalgias.  Skin: Negative for rash.  Neurological: Negative for dizziness, syncope and headaches.  Hematological: Negative for adenopathy.  Psychiatric/Behavioral: Negative for confusion and dysphoric mood.       Objective:   Physical Exam  Constitutional: She appears well-developed and well-nourished.  HENT:  Right Ear: External ear normal.  Left Ear: External ear normal.  Mouth/Throat: Oropharynx is clear and moist.  Neck: Neck supple. No thyromegaly present.  Cardiovascular: Normal rate and regular rhythm. Exam reveals no gallop.  Pulmonary/Chest: Effort normal and breath sounds normal.  Genitourinary:  Genitourinary Comments: Per gyn   Musculoskeletal: She exhibits no edema.  Lymphadenopathy:    She has no cervical adenopathy.  Neurological: She is alert.  Skin: No rash noted.       Assessment:     Physical exam.  The following health maintenance issues were addressed    Plan:     -Discussed Shingrix vaccine and patient requests this.  This was given and she will get booster in 2 to 6 months -Obtain screening labs -Continue regular exercise habits -Continue with yearly flu vaccine  Eulas Post MD [facility] at [facility]

## 2018-07-14 DIAGNOSIS — M531 Cervicobrachial syndrome: Secondary | ICD-10-CM | POA: Diagnosis not present

## 2018-07-14 DIAGNOSIS — M9901 Segmental and somatic dysfunction of cervical region: Secondary | ICD-10-CM | POA: Diagnosis not present

## 2018-07-14 DIAGNOSIS — M9902 Segmental and somatic dysfunction of thoracic region: Secondary | ICD-10-CM | POA: Diagnosis not present

## 2018-07-14 DIAGNOSIS — M9908 Segmental and somatic dysfunction of rib cage: Secondary | ICD-10-CM | POA: Diagnosis not present

## 2018-08-15 DIAGNOSIS — M531 Cervicobrachial syndrome: Secondary | ICD-10-CM | POA: Diagnosis not present

## 2018-08-15 DIAGNOSIS — M9908 Segmental and somatic dysfunction of rib cage: Secondary | ICD-10-CM | POA: Diagnosis not present

## 2018-08-15 DIAGNOSIS — M9902 Segmental and somatic dysfunction of thoracic region: Secondary | ICD-10-CM | POA: Diagnosis not present

## 2018-08-15 DIAGNOSIS — M9901 Segmental and somatic dysfunction of cervical region: Secondary | ICD-10-CM | POA: Diagnosis not present

## 2018-08-22 DIAGNOSIS — L814 Other melanin hyperpigmentation: Secondary | ICD-10-CM | POA: Diagnosis not present

## 2018-08-22 DIAGNOSIS — L820 Inflamed seborrheic keratosis: Secondary | ICD-10-CM | POA: Diagnosis not present

## 2018-08-22 DIAGNOSIS — D1801 Hemangioma of skin and subcutaneous tissue: Secondary | ICD-10-CM | POA: Diagnosis not present

## 2018-08-22 DIAGNOSIS — D225 Melanocytic nevi of trunk: Secondary | ICD-10-CM | POA: Diagnosis not present

## 2018-08-22 DIAGNOSIS — L821 Other seborrheic keratosis: Secondary | ICD-10-CM | POA: Diagnosis not present

## 2018-09-23 DIAGNOSIS — M9902 Segmental and somatic dysfunction of thoracic region: Secondary | ICD-10-CM | POA: Diagnosis not present

## 2018-09-23 DIAGNOSIS — M531 Cervicobrachial syndrome: Secondary | ICD-10-CM | POA: Diagnosis not present

## 2018-09-23 DIAGNOSIS — M9901 Segmental and somatic dysfunction of cervical region: Secondary | ICD-10-CM | POA: Diagnosis not present

## 2018-09-23 DIAGNOSIS — M9908 Segmental and somatic dysfunction of rib cage: Secondary | ICD-10-CM | POA: Diagnosis not present

## 2018-10-11 ENCOUNTER — Telehealth: Payer: BLUE CROSS/BLUE SHIELD | Admitting: Physician Assistant

## 2018-10-11 DIAGNOSIS — R300 Dysuria: Secondary | ICD-10-CM | POA: Diagnosis not present

## 2018-10-11 MED ORDER — CEPHALEXIN 500 MG PO CAPS: 500.0000 mg | ORAL_CAPSULE | Freq: Two times a day (BID) | ORAL | 0 refills | Status: AC

## 2018-10-11 NOTE — Progress Notes (Signed)
We are sorry that you are not feeling well.  Here is how we plan to help!  Based on what you shared with me it looks like you most likely have a simple urinary tract infection.  A UTI (Urinary Tract Infection) is a bacterial infection of the bladder.  Most cases of urinary tract infections are simple to treat but a key part of your care is to encourage you to drink plenty of fluids and watch your symptoms carefully.  I have prescribed Keflex 500 mg twice a day for 7 days.  Your symptoms should gradually improve. Call us if the burning in your urine worsens, you develop worsening fever, back pain or pelvic pain or if your symptoms do not resolve after completing the antibiotic.  Urinary tract infections can be prevented by drinking plenty of water to keep your body hydrated.  Also be sure when you wipe, wipe from front to back and don't hold it in!  If possible, empty your bladder every 4 hours.  Your e-visit answers were reviewed by a board certified advanced clinical practitioner to complete your personal care plan.  Depending on the condition, your plan could have included both over the counter or prescription medications.  If there is a problem please reply once you have received a response from your provider.  Your safety is important to [place].  If you have drug allergies check your prescription carefully.    You can use MyChart to ask questions about today's visit, request a non-urgent call back, or ask for a work or school excuse for 24 hours related to this e-Visit. If it has been greater than 24 hours you will need to follow up with your provider, or enter a new e-Visit to address those concerns.   You will get an e-mail in the next two days asking about your experience.  I hope that your e-visit has been valuable and will speed your recovery. Thank you for using e-visits.    ===View-only below this line===   ----- Message -----    From: Lovena Neighbours    Sent: 10/11/2018  8:58  AM EDT      To: E-Visit Mailing List Subject: E-Visit Submission: Urinary Problems  E-Visit Submission: Urinary Problems --------------------------------  Question: Which of the following are you experiencing? Answer:   Pain while passing urine  Question: When you have pain when passing urine, which of these apply? Answer:   The pain feels like it is on the inside  Question: Are you able to pass urine? Answer:   Yes, I can pass urine.  Question: How long have you had pain or difficulty passing urine? Answer:   More  than two days but less than one week  Question: Do you have a fever? Answer:   No, I do not have a fever  Question: Do you have any of the following? Answer:   I have none of these problems  Question: Do you have an exaggerated sensation of the need to pass urine? Answer:   Yes, the sensation is exaggerated  Question: Do you have the urge to urinate more of less frequently than normal? Answer:   More frequently  Question: What does your urine look like? Answer:   It is clear  Question: Do you have any of the following? Answer:   None of the above  Question: Do you have any of the following? Answer:   No discharge  Question: Do you have any sores on your genitals? Answer:  No  Question: Do you have any history of kidney dysfunction or kidney problems? Answer:   No  Question: Within the past 3 months, have you had any surgery on your kidneys or bladder, or have you had a tube inserted to collect your urine? Answer:   No, I have never had either  Question: Have you had similar symptoms in the past? Answer:   Yes, I have had similar symptoms more than once before  Question: If you had similar symptoms in the past, did any of the following work? Answer:   Pills for urine infection  Question: Please list any additional comments  Answer:   I have had urinary track infections occasionally and am very familiar with the symptoms.  Antibiotic treatment has  been very successful in the past - providing relief within 24-36 hours.  Question: Please list your medication allergies that you may have ? (If 'none' , please list as 'none') Answer:   None

## 2018-12-28 DIAGNOSIS — M9901 Segmental and somatic dysfunction of cervical region: Secondary | ICD-10-CM | POA: Diagnosis not present

## 2018-12-28 DIAGNOSIS — M531 Cervicobrachial syndrome: Secondary | ICD-10-CM | POA: Diagnosis not present

## 2018-12-28 DIAGNOSIS — M791 Myalgia, unspecified site: Secondary | ICD-10-CM | POA: Diagnosis not present

## 2018-12-28 DIAGNOSIS — M9902 Segmental and somatic dysfunction of thoracic region: Secondary | ICD-10-CM | POA: Diagnosis not present

## 2019-02-15 DIAGNOSIS — M531 Cervicobrachial syndrome: Secondary | ICD-10-CM | POA: Diagnosis not present

## 2019-02-15 DIAGNOSIS — M791 Myalgia, unspecified site: Secondary | ICD-10-CM | POA: Diagnosis not present

## 2019-02-15 DIAGNOSIS — M9901 Segmental and somatic dysfunction of cervical region: Secondary | ICD-10-CM | POA: Diagnosis not present

## 2019-02-15 DIAGNOSIS — M9902 Segmental and somatic dysfunction of thoracic region: Secondary | ICD-10-CM | POA: Diagnosis not present

## 2019-02-24 ENCOUNTER — Telehealth: Payer: Self-pay | Admitting: *Deleted

## 2019-02-24 NOTE — Telephone Encounter (Signed)
Shingles given on 06/27/18. OK for 2nd dose or does patient need to start over? Please advise.

## 2019-02-24 NOTE — Telephone Encounter (Signed)
I think this is close enough to 6 months that I would give as second dose.

## 2019-02-24 NOTE — Telephone Encounter (Signed)
Copied from [place] [id number]. Topic: Appointment Scheduling - Scheduling Inquiry for Clinic >> Feb 24, 2019  2:39 PM Monica Zuniga wrote: Reason for CRM:   Pt received her first shingles shot in the office and was told to follow up for her second shot but didn't do so quickly with COVID.  Pt wants to make sure she gets scheduled in time.  Per [facility] send CRM.

## 2019-02-27 NOTE — Telephone Encounter (Signed)
Called patient and LMOVM to return call  [place] for [facility] to Discuss results / PCP / recommendations / Schedule patient  [place] patient and left a detailed voice message on voice mail per Dr. Elease Hashimoto: I think this is close enough to 6 months that I would give as second dose. Please call us and schedule a Nurse Visit for her 2nd Shingles vaccination.  CRM Created.

## 2019-03-10 ENCOUNTER — Ambulatory Visit (INDEPENDENT_AMBULATORY_CARE_PROVIDER_SITE_OTHER): Payer: BC Managed Care – PPO

## 2019-03-10 ENCOUNTER — Other Ambulatory Visit: Payer: Self-pay

## 2019-03-10 DIAGNOSIS — Z23 Encounter for immunization: Secondary | ICD-10-CM | POA: Diagnosis not present

## 2019-03-10 DIAGNOSIS — M531 Cervicobrachial syndrome: Secondary | ICD-10-CM | POA: Diagnosis not present

## 2019-03-10 DIAGNOSIS — M791 Myalgia, unspecified site: Secondary | ICD-10-CM | POA: Diagnosis not present

## 2019-03-10 DIAGNOSIS — M9902 Segmental and somatic dysfunction of thoracic region: Secondary | ICD-10-CM | POA: Diagnosis not present

## 2019-03-10 DIAGNOSIS — M9901 Segmental and somatic dysfunction of cervical region: Secondary | ICD-10-CM | POA: Diagnosis not present

## 2019-03-10 NOTE — Progress Notes (Signed)
Pt tolerated shot well 

## 2019-05-11 DIAGNOSIS — M9902 Segmental and somatic dysfunction of thoracic region: Secondary | ICD-10-CM | POA: Diagnosis not present

## 2019-05-11 DIAGNOSIS — M531 Cervicobrachial syndrome: Secondary | ICD-10-CM | POA: Diagnosis not present

## 2019-05-11 DIAGNOSIS — M791 Myalgia, unspecified site: Secondary | ICD-10-CM | POA: Diagnosis not present

## 2019-05-11 DIAGNOSIS — M9901 Segmental and somatic dysfunction of cervical region: Secondary | ICD-10-CM | POA: Diagnosis not present

## 2019-05-24 DIAGNOSIS — Z01419 Encounter for gynecological examination (general) (routine) without abnormal findings: Secondary | ICD-10-CM | POA: Diagnosis not present

## 2019-05-24 DIAGNOSIS — Z6841 Body Mass Index (BMI) 40.0 and over, adult: Secondary | ICD-10-CM | POA: Diagnosis not present

## 2019-05-24 DIAGNOSIS — Z1231 Encounter for screening mammogram for malignant neoplasm of breast: Secondary | ICD-10-CM | POA: Diagnosis not present

## 2019-06-14 DIAGNOSIS — M9901 Segmental and somatic dysfunction of cervical region: Secondary | ICD-10-CM | POA: Diagnosis not present

## 2019-06-14 DIAGNOSIS — M791 Myalgia, unspecified site: Secondary | ICD-10-CM | POA: Diagnosis not present

## 2019-06-14 DIAGNOSIS — M531 Cervicobrachial syndrome: Secondary | ICD-10-CM | POA: Diagnosis not present

## 2019-06-14 DIAGNOSIS — M9902 Segmental and somatic dysfunction of thoracic region: Secondary | ICD-10-CM | POA: Diagnosis not present

## 2019-06-29 ENCOUNTER — Other Ambulatory Visit: Payer: Self-pay

## 2019-06-30 ENCOUNTER — Ambulatory Visit (INDEPENDENT_AMBULATORY_CARE_PROVIDER_SITE_OTHER): Payer: BC Managed Care – PPO | Admitting: Family Medicine

## 2019-06-30 ENCOUNTER — Other Ambulatory Visit: Payer: Self-pay

## 2019-06-30 ENCOUNTER — Encounter: Payer: Self-pay | Admitting: Family Medicine

## 2019-06-30 VITALS — BP 130/82 | HR 68 | Temp 97.7°F | Ht 63.5 in | Wt 261.1 lb

## 2019-06-30 DIAGNOSIS — Z0000 Encounter for general adult medical examination without abnormal findings: Secondary | ICD-10-CM

## 2019-06-30 DIAGNOSIS — Z23 Encounter for immunization: Secondary | ICD-10-CM | POA: Diagnosis not present

## 2019-06-30 DIAGNOSIS — Z20828 Contact with and (suspected) exposure to other viral communicable diseases: Secondary | ICD-10-CM | POA: Diagnosis not present

## 2019-06-30 DIAGNOSIS — Z20822 Contact with and (suspected) exposure to covid-19: Secondary | ICD-10-CM

## 2019-06-30 LAB — CBC WITH DIFFERENTIAL/PLATELET
Basophils Absolute: 0 10*3/uL (ref 0.0–0.1)
Basophils Relative: 0.5 % (ref 0.0–3.0)
Eosinophils Absolute: 0.1 10*3/uL (ref 0.0–0.7)
Eosinophils Relative: 1.7 % (ref 0.0–5.0)
HCT: 43.8 % (ref 36.0–46.0)
Hemoglobin: 14.6 g/dL (ref 12.0–15.0)
Lymphocytes Relative: 32.2 % (ref 12.0–46.0)
Lymphs Abs: 2.8 10*3/uL (ref 0.7–4.0)
MCHC: 33.3 g/dL (ref 30.0–36.0)
MCV: 88.7 fl (ref 78.0–100.0)
Monocytes Absolute: 0.7 10*3/uL (ref 0.1–1.0)
Monocytes Relative: 7.9 % (ref 3.0–12.0)
Neutro Abs: 4.9 10*3/uL (ref 1.4–7.7)
Neutrophils Relative %: 57.7 % (ref 43.0–77.0)
Platelets: 208 10*3/uL (ref 150.0–400.0)
RBC: 4.93 Mil/uL (ref 3.87–5.11)
RDW: 14 % (ref 11.5–15.5)
WBC: 8.5 10*3/uL (ref 4.0–10.5)

## 2019-06-30 LAB — TSH: TSH: 5.34 u[IU]/mL — ABNORMAL HIGH (ref 0.35–4.50)

## 2019-06-30 LAB — HEMOGLOBIN A1C: Hgb A1c MFr Bld: 5.6 % (ref 4.6–6.5)

## 2019-06-30 LAB — HEPATIC FUNCTION PANEL
ALT: 23 U/L (ref 0–35)
AST: 16 U/L (ref 0–37)
Albumin: 4.4 g/dL (ref 3.5–5.2)
Alkaline Phosphatase: 82 U/L (ref 39–117)
Bilirubin, Direct: 0.1 mg/dL (ref 0.0–0.3)
Total Bilirubin: 0.4 mg/dL (ref 0.2–1.2)
Total Protein: 6.7 g/dL (ref 6.0–8.3)

## 2019-06-30 LAB — LIPID PANEL
Cholesterol: 187 mg/dL (ref 0–200)
HDL: 65.2 mg/dL (ref >39.00)
LDL Cholesterol: 102 mg/dL — ABNORMAL HIGH (ref 0–99)
NonHDL: 121.4
Total CHOL/HDL Ratio: 3
Triglycerides: 97 mg/dL (ref 0.0–149.0)
VLDL: 19.4 mg/dL (ref 0.0–40.0)

## 2019-06-30 LAB — BASIC METABOLIC PANEL
BUN: 20 mg/dL (ref 6–23)
CO2: 29 mEq/L (ref 19–32)
Calcium: 9.5 mg/dL (ref 8.4–10.5)
Chloride: 101 mEq/L (ref 96–112)
Creatinine, Ser: 0.79 mg/dL (ref 0.40–1.20)
GFR: 75.69 mL/min (ref >60.00)
Glucose, Bld: 106 mg/dL — ABNORMAL HIGH (ref 70–99)
Potassium: 4.9 mEq/L (ref 3.5–5.1)
Sodium: 140 mEq/L (ref 135–145)

## 2019-06-30 NOTE — Progress Notes (Signed)
Subjective:     Patient ID: Monica Zuniga, female   DOB: [birth year], 53 y.o.   MRN: [id number]  HPI Monica Zuniga is seen for physical exam.  She sees gynecologist yearly and also sees dermatologist yearly for screening.  She needs flu vaccine.  She also would like to consider Covid test probably sometime next week.  She has parents that live in [place] and she will be visiting them.  They are in a retirement community and she has to have proof of negative testing prior to leaving.  She has been asymptomatic.  She does have mild hyperglycemia/prediabetic range glucose from lab work last year with glucose 111.  No polyuria or polydipsia.  No family history of type 2 diabetes in immediate relatives.  Her mother has Alzheimer's disease.  She and her husband have bought a house [employer] and they are doing remodeling.  That will eventually be the retirement home.  They have been staying at their for several months now during the pandemic working virtually  Past Medical History:  Diagnosis Date  . 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 EYE SURGERY     as a baby- surgery for eye problem   . JOINT REPLACEMENT     L- replacement- 2011  . KNEE ARTHROSCOPY     left knee scope 2005, right 2008  . TOTAL KNEE ARTHROPLASTY  06/2012   RIGHT KNEE  . TOTAL KNEE ARTHROPLASTY  06/27/2012   Procedure: TOTAL KNEE ARTHROPLASTY;  Surgeon: Kerin Salen, MD;  Location: [place];  Service: Orthopedics;  Laterality: Right;  . WISDOM TOOTH EXTRACTION     /w sedation    reports that she has never smoked. She has never used smokeless tobacco. She reports current alcohol use of about 4.0 - 6.0 standard drinks of alcohol per week. She reports that she does not use drugs. family history includes Arthritis in an other family member; Diabetes in her paternal grandmother. Allergies  Allergen Reactions  . Shellfish Allergy Other (See Comments)    Numbness     Health Maintenance  Topic Date Due  . PAP SMEAR-Modifier  12/04/2017  . INFLUENZA VACCINE  02/25/2019  . HIV Screening  06/22/2037 (Originally 12/20/1979)  . MAMMOGRAM  05/23/2021  . COLONOSCOPY  07/14/2025  . TETANUS/TDAP  01/27/2027   Wt Readings from Last 3 Encounters:  06/30/19 261 lb 1.6 oz (118.4 kg)  06/27/18 251 lb 4.8 oz (114 kg)  01/17/18 249 lb 3.2 oz (113 kg)     Review of Systems  Constitutional: Negative for activity change, appetite change, fatigue, fever and unexpected weight change.  HENT: Negative for ear pain, hearing loss, sore throat and trouble swallowing.   Eyes: Negative for visual disturbance.  Respiratory: Negative for cough and shortness of breath.   Cardiovascular: Negative for chest pain and palpitations.  Gastrointestinal: Negative for abdominal pain, blood in stool, constipation and diarrhea.  Endocrine: Negative for polydipsia and polyuria.  Genitourinary: Negative for dysuria and hematuria.  Musculoskeletal: Negative for arthralgias, back pain and myalgias.  Skin: Negative for rash.  Neurological: Negative for dizziness, syncope and headaches.  Hematological: Negative for adenopathy.  Psychiatric/Behavioral: Negative for confusion and dysphoric mood.       Objective:   Physical Exam Constitutional:      Appearance: She is well-developed.  HENT:     Head: Normocephalic and atraumatic.  Eyes:     Pupils: Pupils are equal, round, and reactive to light.  Neck:     Musculoskeletal: Normal range of motion and neck supple.     Thyroid: No thyromegaly.  Cardiovascular:     Rate and Rhythm: Normal rate and regular rhythm.     Heart sounds: Normal heart sounds. No murmur.  Pulmonary:     Effort: No respiratory distress.     Breath sounds: Normal breath sounds. No wheezing or rales.  Abdominal:     General: Bowel sounds are normal. There is no distension.     Palpations: Abdomen is soft. There is no mass.     Tenderness: There is no  abdominal tenderness. There is no guarding or rebound.  Musculoskeletal:     Right lower leg: No edema.     Left lower leg: No edema.  Lymphadenopathy:     Cervical: No cervical adenopathy.  Skin:    Findings: No rash.  Neurological:     Mental Status: She is alert and oriented to person, place, and time.     Cranial Nerves: No cranial nerve deficit.  Psychiatric:        Behavior: Behavior normal.        Thought Content: Thought content normal.        Judgment: Judgment normal.        Assessment:     Physical exam.  Generally very healthy.  She has obesity but no other significant comorbidities other than osteoarthritis and mildly elevated glucose last year.  We discussed the following health care/health maintenance issues    Plan:     -Flu vaccine given -We placed order for Covid testing probably sometime next week prior to her trip to [place] fasting labs including A1c -Shingles vaccine already given -She will continue GYN follow-up regarding her Pap smears and mammograms -Colonoscopy due 2026  Eulas Post MD [facility] at [facility]

## 2019-06-30 NOTE — Patient Instructions (Signed)

## 2019-06-30 NOTE — Addendum Note (Signed)
Addended by: Anibal Henderson on: 06/30/2019 08:38 AM   Modules accepted: Orders

## 2019-07-04 ENCOUNTER — Other Ambulatory Visit: Payer: Self-pay

## 2019-07-04 DIAGNOSIS — R7989 Other specified abnormal findings of blood chemistry: Secondary | ICD-10-CM

## 2019-07-07 ENCOUNTER — Other Ambulatory Visit: Payer: Self-pay

## 2019-07-07 DIAGNOSIS — Z20822 Contact with and (suspected) exposure to covid-19: Secondary | ICD-10-CM

## 2019-07-09 LAB — NOVEL CORONAVIRUS, NAA: SARS-CoV-2, NAA: NOT DETECTED

## 2019-08-02 DIAGNOSIS — M9902 Segmental and somatic dysfunction of thoracic region: Secondary | ICD-10-CM | POA: Diagnosis not present

## 2019-08-02 DIAGNOSIS — M791 Myalgia, unspecified site: Secondary | ICD-10-CM | POA: Diagnosis not present

## 2019-08-02 DIAGNOSIS — M9901 Segmental and somatic dysfunction of cervical region: Secondary | ICD-10-CM | POA: Diagnosis not present

## 2019-08-02 DIAGNOSIS — M531 Cervicobrachial syndrome: Secondary | ICD-10-CM | POA: Diagnosis not present

## 2019-08-03 DIAGNOSIS — H0102B Squamous blepharitis left eye, upper and lower eyelids: Secondary | ICD-10-CM | POA: Diagnosis not present

## 2019-08-03 DIAGNOSIS — H04123 Dry eye syndrome of bilateral lacrimal glands: Secondary | ICD-10-CM | POA: Diagnosis not present

## 2019-08-03 DIAGNOSIS — H10413 Chronic giant papillary conjunctivitis, bilateral: Secondary | ICD-10-CM | POA: Diagnosis not present

## 2019-08-03 DIAGNOSIS — H0102A Squamous blepharitis right eye, upper and lower eyelids: Secondary | ICD-10-CM | POA: Diagnosis not present

## 2019-09-13 DIAGNOSIS — M9902 Segmental and somatic dysfunction of thoracic region: Secondary | ICD-10-CM | POA: Diagnosis not present

## 2019-09-13 DIAGNOSIS — M791 Myalgia, unspecified site: Secondary | ICD-10-CM | POA: Diagnosis not present

## 2019-09-13 DIAGNOSIS — M531 Cervicobrachial syndrome: Secondary | ICD-10-CM | POA: Diagnosis not present

## 2019-09-13 DIAGNOSIS — M9901 Segmental and somatic dysfunction of cervical region: Secondary | ICD-10-CM | POA: Diagnosis not present

## 2019-09-26 ENCOUNTER — Ambulatory Visit: Payer: BC Managed Care – PPO | Attending: Internal Medicine

## 2019-09-26 DIAGNOSIS — Z20822 Contact with and (suspected) exposure to covid-19: Secondary | ICD-10-CM | POA: Diagnosis not present

## 2019-09-27 LAB — NOVEL CORONAVIRUS, NAA: SARS-CoV-2, NAA: NOT DETECTED

## 2019-10-12 DIAGNOSIS — M9902 Segmental and somatic dysfunction of thoracic region: Secondary | ICD-10-CM | POA: Diagnosis not present

## 2019-10-12 DIAGNOSIS — M9901 Segmental and somatic dysfunction of cervical region: Secondary | ICD-10-CM | POA: Diagnosis not present

## 2019-10-12 DIAGNOSIS — M791 Myalgia, unspecified site: Secondary | ICD-10-CM | POA: Diagnosis not present

## 2019-10-12 DIAGNOSIS — M531 Cervicobrachial syndrome: Secondary | ICD-10-CM | POA: Diagnosis not present

## 2019-10-31 ENCOUNTER — Other Ambulatory Visit: Payer: BC Managed Care – PPO

## 2019-11-03 ENCOUNTER — Other Ambulatory Visit: Payer: Self-pay

## 2019-11-06 ENCOUNTER — Other Ambulatory Visit: Payer: Self-pay

## 2019-11-06 ENCOUNTER — Other Ambulatory Visit (INDEPENDENT_AMBULATORY_CARE_PROVIDER_SITE_OTHER): Payer: BC Managed Care – PPO

## 2019-11-06 DIAGNOSIS — R7989 Other specified abnormal findings of blood chemistry: Secondary | ICD-10-CM | POA: Diagnosis not present

## 2019-11-06 LAB — TSH: TSH: 7.59 u[IU]/mL — ABNORMAL HIGH (ref 0.35–4.50)

## 2019-11-06 LAB — T4, FREE: Free T4: 0.75 ng/dL (ref 0.60–1.60)

## 2019-11-07 ENCOUNTER — Other Ambulatory Visit: Payer: Self-pay

## 2019-11-07 DIAGNOSIS — E038 Other specified hypothyroidism: Secondary | ICD-10-CM

## 2019-11-07 DIAGNOSIS — E039 Hypothyroidism, unspecified: Secondary | ICD-10-CM

## 2019-11-07 MED ORDER — LEVOTHYROXINE SODIUM 50 MCG PO TABS: 50.0000 ug | ORAL_TABLET | Freq: Every day | ORAL | 0 refills | Status: DC

## 2019-11-17 DIAGNOSIS — M9901 Segmental and somatic dysfunction of cervical region: Secondary | ICD-10-CM | POA: Diagnosis not present

## 2019-11-17 DIAGNOSIS — M791 Myalgia, unspecified site: Secondary | ICD-10-CM | POA: Diagnosis not present

## 2019-11-17 DIAGNOSIS — M531 Cervicobrachial syndrome: Secondary | ICD-10-CM | POA: Diagnosis not present

## 2019-11-17 DIAGNOSIS — M9902 Segmental and somatic dysfunction of thoracic region: Secondary | ICD-10-CM | POA: Diagnosis not present

## 2019-12-12 DIAGNOSIS — M791 Myalgia, unspecified site: Secondary | ICD-10-CM | POA: Diagnosis not present

## 2019-12-12 DIAGNOSIS — M9902 Segmental and somatic dysfunction of thoracic region: Secondary | ICD-10-CM | POA: Diagnosis not present

## 2019-12-12 DIAGNOSIS — M531 Cervicobrachial syndrome: Secondary | ICD-10-CM | POA: Diagnosis not present

## 2019-12-12 DIAGNOSIS — M9901 Segmental and somatic dysfunction of cervical region: Secondary | ICD-10-CM | POA: Diagnosis not present

## 2020-01-24 DIAGNOSIS — M791 Myalgia, unspecified site: Secondary | ICD-10-CM | POA: Diagnosis not present

## 2020-01-24 DIAGNOSIS — M9901 Segmental and somatic dysfunction of cervical region: Secondary | ICD-10-CM | POA: Diagnosis not present

## 2020-01-24 DIAGNOSIS — M9902 Segmental and somatic dysfunction of thoracic region: Secondary | ICD-10-CM | POA: Diagnosis not present

## 2020-01-24 DIAGNOSIS — M531 Cervicobrachial syndrome: Secondary | ICD-10-CM | POA: Diagnosis not present

## 2020-01-28 ENCOUNTER — Other Ambulatory Visit: Payer: Self-pay | Admitting: Family Medicine

## 2020-01-30 NOTE — Telephone Encounter (Signed)
Refill OK but does need to come in soon and get follow up labs- TSH order already in chart.

## 2020-02-02 ENCOUNTER — Other Ambulatory Visit (INDEPENDENT_AMBULATORY_CARE_PROVIDER_SITE_OTHER): Payer: BC Managed Care – PPO

## 2020-02-02 ENCOUNTER — Other Ambulatory Visit: Payer: Self-pay

## 2020-02-02 DIAGNOSIS — E039 Hypothyroidism, unspecified: Secondary | ICD-10-CM

## 2020-02-02 DIAGNOSIS — E038 Other specified hypothyroidism: Secondary | ICD-10-CM

## 2020-02-02 LAB — T4, FREE: Free T4: 0.89 ng/dL (ref 0.60–1.60)

## 2020-02-02 LAB — TSH: TSH: 3.25 u[IU]/mL (ref 0.35–4.50)

## 2020-02-05 ENCOUNTER — Encounter: Payer: Self-pay | Admitting: Family Medicine

## 2020-02-14 DIAGNOSIS — M9901 Segmental and somatic dysfunction of cervical region: Secondary | ICD-10-CM | POA: Diagnosis not present

## 2020-02-14 DIAGNOSIS — M791 Myalgia, unspecified site: Secondary | ICD-10-CM | POA: Diagnosis not present

## 2020-02-14 DIAGNOSIS — M9902 Segmental and somatic dysfunction of thoracic region: Secondary | ICD-10-CM | POA: Diagnosis not present

## 2020-02-14 DIAGNOSIS — M531 Cervicobrachial syndrome: Secondary | ICD-10-CM | POA: Diagnosis not present

## 2020-03-20 DIAGNOSIS — M791 Myalgia, unspecified site: Secondary | ICD-10-CM | POA: Diagnosis not present

## 2020-03-20 DIAGNOSIS — M531 Cervicobrachial syndrome: Secondary | ICD-10-CM | POA: Diagnosis not present

## 2020-03-20 DIAGNOSIS — M9902 Segmental and somatic dysfunction of thoracic region: Secondary | ICD-10-CM | POA: Diagnosis not present

## 2020-03-20 DIAGNOSIS — M9901 Segmental and somatic dysfunction of cervical region: Secondary | ICD-10-CM | POA: Diagnosis not present

## 2020-04-25 DIAGNOSIS — M9902 Segmental and somatic dysfunction of thoracic region: Secondary | ICD-10-CM | POA: Diagnosis not present

## 2020-04-25 DIAGNOSIS — M9901 Segmental and somatic dysfunction of cervical region: Secondary | ICD-10-CM | POA: Diagnosis not present

## 2020-04-25 DIAGNOSIS — M791 Myalgia, unspecified site: Secondary | ICD-10-CM | POA: Diagnosis not present

## 2020-04-25 DIAGNOSIS — M531 Cervicobrachial syndrome: Secondary | ICD-10-CM | POA: Diagnosis not present

## 2020-04-26 ENCOUNTER — Other Ambulatory Visit: Payer: Self-pay | Admitting: Family Medicine

## 2020-06-05 DIAGNOSIS — E669 Obesity, unspecified: Secondary | ICD-10-CM | POA: Diagnosis not present

## 2020-06-06 DIAGNOSIS — M791 Myalgia, unspecified site: Secondary | ICD-10-CM | POA: Diagnosis not present

## 2020-06-06 DIAGNOSIS — M9902 Segmental and somatic dysfunction of thoracic region: Secondary | ICD-10-CM | POA: Diagnosis not present

## 2020-06-06 DIAGNOSIS — M531 Cervicobrachial syndrome: Secondary | ICD-10-CM | POA: Diagnosis not present

## 2020-06-06 DIAGNOSIS — M9901 Segmental and somatic dysfunction of cervical region: Secondary | ICD-10-CM | POA: Diagnosis not present

## 2020-06-12 DIAGNOSIS — E669 Obesity, unspecified: Secondary | ICD-10-CM | POA: Diagnosis not present

## 2020-06-26 DIAGNOSIS — E669 Obesity, unspecified: Secondary | ICD-10-CM | POA: Diagnosis not present

## 2020-07-01 ENCOUNTER — Other Ambulatory Visit: Payer: Self-pay

## 2020-07-01 ENCOUNTER — Encounter: Payer: Self-pay | Admitting: Family Medicine

## 2020-07-01 ENCOUNTER — Ambulatory Visit (INDEPENDENT_AMBULATORY_CARE_PROVIDER_SITE_OTHER): Payer: BC Managed Care – PPO | Admitting: Family Medicine

## 2020-07-01 VITALS — BP 127/68 | HR 63 | Temp 97.6°F | Resp 12 | Ht 63.5 in | Wt 268.0 lb

## 2020-07-01 DIAGNOSIS — Z01419 Encounter for gynecological examination (general) (routine) without abnormal findings: Secondary | ICD-10-CM | POA: Diagnosis not present

## 2020-07-01 DIAGNOSIS — Z1231 Encounter for screening mammogram for malignant neoplasm of breast: Secondary | ICD-10-CM | POA: Diagnosis not present

## 2020-07-01 DIAGNOSIS — Z0000 Encounter for general adult medical examination without abnormal findings: Secondary | ICD-10-CM

## 2020-07-01 DIAGNOSIS — Z6841 Body Mass Index (BMI) 40.0 and over, adult: Secondary | ICD-10-CM | POA: Diagnosis not present

## 2020-07-01 DIAGNOSIS — Z23 Encounter for immunization: Secondary | ICD-10-CM | POA: Diagnosis not present

## 2020-07-01 NOTE — Progress Notes (Signed)
Established Patient Office Visit  Subjective:  Patient ID: Monica Zuniga, female    DOB: 1966/03/26  Age: 54 y.o. MRN: [id number]  CC:  Chief Complaint  Patient presents with  . Annual Exam    HPI Monica Zuniga presents for physical exam.  She has history of hypothyroidism, osteoarthritis, and past history of hyperplastic colon polyps.  Generally very healthy.  She is currently working with someone to try to lose some weight.  She has lost about 5 pounds thus far.  Currently takes levothyroxine 50 mcg daily but no other prescription medications.  Health maintenance reviewed  -Needs flu vaccine -Covid vaccines complete including booster -Tetanus due 2028 -Colonoscopy due 2026 -She is GYN getting mammogram and Pap smear at least every other year -No history of hepatitis C screening but low risk -Has had previous shingles vaccines  Family history-mother died just this year at 8 of dementia complications.  Father is alive.  She has a sister with no significant health problems  Social history-she is married.  She has 3 children.  Non-smoker.  Currently is working remote and living most of the time up at [facility] in [place]  Past Medical History:  Diagnosis Date  . OSTEOARTHRITIS, KNEES, BILATERAL 06/02/2010    Past Surgical History:  Procedure Laterality Date  . EYE SURGERY     as a baby- surgery for eye problem   . JOINT REPLACEMENT     L- replacement- 2011  . KNEE ARTHROSCOPY     left knee scope 2005, right 2008  . TOTAL KNEE ARTHROPLASTY  06/2012   RIGHT KNEE  . TOTAL KNEE ARTHROPLASTY  06/27/2012   Procedure: TOTAL KNEE ARTHROPLASTY;  Surgeon: Kerin Salen, MD;  Location: [place];  Service: Orthopedics;  Laterality: Right;  . WISDOM TOOTH EXTRACTION     /w sedation    Family History  Problem Relation Age of Onset  . Dementia Mother   . Diabetes Paternal Grandmother        type ll  . Arthritis Other   . Colon cancer Neg Hx     Social  History   Socioeconomic History  . Marital status: Married    Spouse name: Not on file  . Number of children: Not on file  . Years of education: Not on file  . Highest education level: Not on file  Occupational History  . Not on file  Tobacco Use  . Smoking status: Never Smoker  . Smokeless tobacco: Never Used  Vaping Use  . Vaping Use: Never used  Substance and Sexual Activity  . Alcohol use: Yes    Alcohol/week: 4.0 - 6.0 standard drinks    Types: 4 - 6 Shots of liquor per week    Comment: on weekends only  . Drug use: No  . Sexual activity: Yes  Other Topics Concern  . Not on file  Social History Narrative  . Not on file   Social Determinants of Health   Financial Resource Strain:   . Difficulty of Paying Living Expenses: Not on file  Food Insecurity:   . Worried About Charity fundraiser in the Last Year: Not on file  . Ran Out of Food in the Last Year: Not on file  Transportation Needs:   . Lack of Transportation (Medical): Not on file  . Lack of Transportation (Non-Medical): Not on file  Physical Activity:   . Days of Exercise per Week: Not on file  . Minutes of Exercise  per Session: Not on file  Stress:   . Feeling of Stress : Not on file  Social Connections:   . Frequency of Communication with Friends and Family: Not on file  . Frequency of Social Gatherings with Friends and Family: Not on file  . Attends Religious Services: Not on file  . Active Member of Clubs or Organizations: Not on file  . Attends Archivist Meetings: Not on file  . Marital Status: Not on file  Intimate Partner Violence:   . Fear of Current or Ex-Partner: Not on file  . Emotionally Abused: Not on file  . Physically Abused: Not on file  . Sexually Abused: Not on file    Outpatient Medications Prior to Visit  Medication Sig Dispense Refill  . b complex vitamins tablet Take 1 tablet by mouth daily.    Marland Kitchen GLUCOSAMINE PO Take 1 tablet by mouth 2 (two) times daily.     Marland Kitchen  levothyroxine (SYNTHROID) 50 MCG tablet TAKE 1 TABLET BY MOUTH EVERY DAY 90 tablet 0  . Multiple Vitamin (MULTIVITAMIN WITH MINERALS) TABS Take 1 tablet by mouth daily.    . Omega-3 Fatty Acids (FISH OIL PO) Take by mouth.    . Probiotic Product (PROBIOTIC DAILY PO) Take 1 tablet by mouth daily.    . Vitamin D, Cholecalciferol, 25 MCG (1000 UT) CAPS Take 1 capsule by mouth daily.    Marland Kitchen amoxicillin (AMOXIL) 500 MG capsule TAKE 4 CAPSULES 1 HOUR PRIOR TO DENTAL TREATMENT     No facility-administered medications prior to visit.    Allergies  Allergen Reactions  . Shellfish Allergy Other (See Comments)    Numbness     ROS Review of Systems  Constitutional: Negative for activity change, appetite change, fatigue, fever and unexpected weight change.  HENT: Negative for ear pain, hearing loss, sore throat and trouble swallowing.   Eyes: Negative for visual disturbance.  Respiratory: Negative for cough and shortness of breath.   Cardiovascular: Negative for chest pain and palpitations.  Gastrointestinal: Negative for abdominal pain, blood in stool, constipation and diarrhea.  Genitourinary: Negative for dysuria and hematuria.  Musculoskeletal: Negative for arthralgias, back pain and myalgias.  Skin: Negative for rash.  Neurological: Negative for dizziness, syncope and headaches.  Hematological: Negative for adenopathy.  Psychiatric/Behavioral: Negative for confusion and dysphoric mood.      Objective:    Physical Exam Constitutional:      Appearance: She is well-developed.  HENT:     Head: Normocephalic and atraumatic.  Eyes:     Pupils: Pupils are equal, round, and reactive to light.  Neck:     Thyroid: No thyromegaly.  Cardiovascular:     Rate and Rhythm: Normal rate and regular rhythm.     Heart sounds: Normal heart sounds. No murmur heard.   Pulmonary:     Effort: No respiratory distress.     Breath sounds: Normal breath sounds. No wheezing or rales.  Abdominal:      General: Bowel sounds are normal. There is no distension.     Palpations: Abdomen is soft. There is no mass.     Tenderness: There is no abdominal tenderness. There is no guarding or rebound.  Musculoskeletal:        General: Normal range of motion.     Cervical back: Normal range of motion and neck supple.  Lymphadenopathy:     Cervical: No cervical adenopathy.  Skin:    Findings: No rash.  Neurological:     Mental  Status: She is alert and oriented to person, place, and time.     Cranial Nerves: No cranial nerve deficit.  Psychiatric:        Behavior: Behavior normal.        Thought Content: Thought content normal.        Judgment: Judgment normal.     BP 127/68 (BP Location: Left Arm, Cuff Size: Large)   Pulse 63   Temp 97.6 F (36.4 C) (Oral)   Resp 12   Ht 5' 3.5" (1.613 m)   Wt 268 lb (121.6 kg)   LMP 03/28/2015   SpO2 99%   BMI 46.73 kg/m  Wt Readings from Last 3 Encounters:  07/01/20 268 lb (121.6 kg)  06/30/19 261 lb 1.6 oz (118.4 kg)  06/27/18 251 lb 4.8 oz (114 kg)     Health Maintenance Due  Topic Date Due  . Hepatitis C Screening  Never done  . INFLUENZA VACCINE  02/25/2020    There are no preventive care reminders to display for this patient.  Lab Results  Component Value Date   TSH 3.25 02/02/2020   Lab Results  Component Value Date   WBC 8.5 06/30/2019   HGB 14.6 06/30/2019   HCT 43.8 06/30/2019   MCV 88.7 06/30/2019   PLT 208.0 06/30/2019   Lab Results  Component Value Date   NA 140 06/30/2019   K 4.9 06/30/2019   CO2 29 06/30/2019   GLUCOSE 106 (H) 06/30/2019   BUN 20 06/30/2019   CREATININE 0.79 06/30/2019   BILITOT 0.4 06/30/2019   ALKPHOS 82 06/30/2019   AST 16 06/30/2019   ALT 23 06/30/2019   PROT 6.7 06/30/2019   ALBUMIN 4.4 06/30/2019   CALCIUM 9.5 06/30/2019   GFR 75.69 06/30/2019   Lab Results  Component Value Date   CHOL 187 06/30/2019   Lab Results  Component Value Date   HDL 65.20 06/30/2019   Lab Results   Component Value Date   LDLCALC 102 (H) 06/30/2019   Lab Results  Component Value Date   TRIG 97.0 06/30/2019   Lab Results  Component Value Date   CHOLHDL 3 06/30/2019   Lab Results  Component Value Date   HGBA1C 5.6 06/30/2019      Assessment & Plan:   Physical exam.  Generally healthy 54 year old female.  We discussed several health maintenance issues as below  -She plans to continue GYN follow-up annually for mammograms and periodic Pap smears. -She plans to follow-up yearly with dermatology for skin screening -Flu vaccine given -Obtain screening labs -She is encouraged to try to lose some weight -Screening labs obtained  No orders of the defined types were placed in this encounter.   Follow-up: No follow-ups on file.    Carolann Littler, MD

## 2020-07-01 NOTE — Patient Instructions (Signed)

## 2020-07-02 LAB — CBC WITH DIFFERENTIAL/PLATELET
Absolute Monocytes: 568 cells/uL (ref 200–950)
Basophils Absolute: 50 cells/uL (ref 0–200)
Basophils Relative: 0.7 %
Eosinophils Absolute: 92 cells/uL (ref 15–500)
Eosinophils Relative: 1.3 %
HCT: 43.8 % (ref 35.0–45.0)
Hemoglobin: 14.6 g/dL (ref 11.7–15.5)
Lymphs Abs: 2549 cells/uL (ref 850–3900)
MCH: 29.6 pg (ref 27.0–33.0)
MCHC: 33.3 g/dL (ref 32.0–36.0)
MCV: 88.8 fL (ref 80.0–100.0)
MPV: 10.7 fL (ref 7.5–12.5)
Monocytes Relative: 8 %
Neutro Abs: 3841 cells/uL (ref 1500–7800)
Neutrophils Relative %: 54.1 %
Platelets: 217 10*3/uL (ref 140–400)
RBC: 4.93 10*6/uL (ref 3.80–5.10)
RDW: 13 % (ref 11.0–15.0)
Total Lymphocyte: 35.9 %
WBC: 7.1 10*3/uL (ref 3.8–10.8)

## 2020-07-02 LAB — HEPATIC FUNCTION PANEL
AG Ratio: 1.8 (calc) (ref 1.0–2.5)
ALT: 24 U/L (ref 6–29)
AST: 20 U/L (ref 10–35)
Albumin: 4.5 g/dL (ref 3.6–5.1)
Alkaline phosphatase (APISO): 88 U/L (ref 37–153)
Bilirubin, Direct: 0.1 mg/dL (ref 0.0–0.2)
Globulin: 2.5 g/dL (calc) (ref 1.9–3.7)
Indirect Bilirubin: 0.4 mg/dL (calc) (ref 0.2–1.2)
Total Bilirubin: 0.5 mg/dL (ref 0.2–1.2)
Total Protein: 7 g/dL (ref 6.1–8.1)

## 2020-07-02 LAB — BASIC METABOLIC PANEL
BUN: 21 mg/dL (ref 7–25)
CO2: 26 mmol/L (ref 20–32)
Calcium: 9.6 mg/dL (ref 8.6–10.4)
Chloride: 101 mmol/L (ref 98–110)
Creat: 0.7 mg/dL (ref 0.50–1.05)
Glucose, Bld: 89 mg/dL (ref 65–99)
Potassium: 4.7 mmol/L (ref 3.5–5.3)
Sodium: 139 mmol/L (ref 135–146)

## 2020-07-02 LAB — HEPATITIS C ANTIBODY
Hepatitis C Ab: NONREACTIVE
SIGNAL TO CUT-OFF: 0.01 (ref <1.00)

## 2020-07-02 LAB — LIPID PANEL
Cholesterol: 171 mg/dL (ref <200)
HDL: 66 mg/dL (ref >=50)
LDL Cholesterol (Calc): 84 mg/dL (calc)
Non-HDL Cholesterol (Calc): 105 mg/dL (calc) (ref <130)
Total CHOL/HDL Ratio: 2.6 (calc) (ref <5.0)
Triglycerides: 109 mg/dL (ref <150)

## 2020-07-02 LAB — TSH: TSH: 2.92 mIU/L

## 2020-07-02 NOTE — Progress Notes (Signed)
Labs all look great.  Hepatitis C antibody negative.  Thyroid normal range.  CBC, kidney function, liver panel, lipid panel all normal, blood sugars have been slightly elevated in the past but are now normal

## 2020-07-03 DIAGNOSIS — M791 Myalgia, unspecified site: Secondary | ICD-10-CM | POA: Diagnosis not present

## 2020-07-03 DIAGNOSIS — M531 Cervicobrachial syndrome: Secondary | ICD-10-CM | POA: Diagnosis not present

## 2020-07-03 DIAGNOSIS — M9902 Segmental and somatic dysfunction of thoracic region: Secondary | ICD-10-CM | POA: Diagnosis not present

## 2020-07-03 DIAGNOSIS — M9901 Segmental and somatic dysfunction of cervical region: Secondary | ICD-10-CM | POA: Diagnosis not present

## 2020-07-10 DIAGNOSIS — Z713 Dietary counseling and surveillance: Secondary | ICD-10-CM | POA: Diagnosis not present

## 2020-07-10 DIAGNOSIS — E669 Obesity, unspecified: Secondary | ICD-10-CM | POA: Diagnosis not present

## 2020-07-17 DIAGNOSIS — M9902 Segmental and somatic dysfunction of thoracic region: Secondary | ICD-10-CM | POA: Diagnosis not present

## 2020-07-17 DIAGNOSIS — M9901 Segmental and somatic dysfunction of cervical region: Secondary | ICD-10-CM | POA: Diagnosis not present

## 2020-07-17 DIAGNOSIS — M791 Myalgia, unspecified site: Secondary | ICD-10-CM | POA: Diagnosis not present

## 2020-07-17 DIAGNOSIS — M531 Cervicobrachial syndrome: Secondary | ICD-10-CM | POA: Diagnosis not present

## 2020-07-18 ENCOUNTER — Other Ambulatory Visit: Payer: Self-pay | Admitting: Family Medicine

## 2020-07-24 DIAGNOSIS — E669 Obesity, unspecified: Secondary | ICD-10-CM | POA: Diagnosis not present

## 2020-07-24 DIAGNOSIS — Z713 Dietary counseling and surveillance: Secondary | ICD-10-CM | POA: Diagnosis not present

## 2020-07-25 ENCOUNTER — Telehealth: Payer: Self-pay | Admitting: Family Medicine

## 2020-07-25 MED ORDER — ACETAZOLAMIDE 125 MG PO TABS: ORAL_TABLET | ORAL | 0 refills | Status: DC

## 2020-07-25 NOTE — Telephone Encounter (Signed)
The patient is traveling to the [facility] and she needs Diamox called in for her because of altitude change.  [id number] - [place], [place] - [address] Phone:  [phone]  Fax:  [phone]

## 2020-07-25 NOTE — Telephone Encounter (Signed)
Rx done and the pt was informed. 

## 2020-07-25 NOTE — Telephone Encounter (Signed)
I am sorry but that would need to come from PCP

## 2020-07-25 NOTE — Addendum Note (Signed)
Addended by: Johnella Moloney on: 07/25/2020 01:53 PM   Modules accepted: Orders

## 2020-07-25 NOTE — Telephone Encounter (Signed)
May send in Acetazolamide 125 mg po bid- start 24 hr prior to ascent and D/C 2-3 days after peak arrival or upon descent  #20

## 2020-07-30 ENCOUNTER — Other Ambulatory Visit: Payer: Self-pay

## 2020-07-30 MED ORDER — LEVOTHYROXINE SODIUM 50 MCG PO TABS
50.0000 ug | ORAL_TABLET | Freq: Every day | ORAL | 0 refills | Status: DC
Start: 2020-07-30 — End: 2020-10-14

## 2020-08-07 DIAGNOSIS — M9901 Segmental and somatic dysfunction of cervical region: Secondary | ICD-10-CM | POA: Diagnosis not present

## 2020-08-07 DIAGNOSIS — M791 Myalgia, unspecified site: Secondary | ICD-10-CM | POA: Diagnosis not present

## 2020-08-07 DIAGNOSIS — M531 Cervicobrachial syndrome: Secondary | ICD-10-CM | POA: Diagnosis not present

## 2020-08-07 DIAGNOSIS — M9902 Segmental and somatic dysfunction of thoracic region: Secondary | ICD-10-CM | POA: Diagnosis not present

## 2020-08-14 DIAGNOSIS — E669 Obesity, unspecified: Secondary | ICD-10-CM | POA: Diagnosis not present

## 2020-08-14 DIAGNOSIS — Z713 Dietary counseling and surveillance: Secondary | ICD-10-CM | POA: Diagnosis not present

## 2020-08-15 DIAGNOSIS — E669 Obesity, unspecified: Secondary | ICD-10-CM | POA: Diagnosis not present

## 2020-08-15 DIAGNOSIS — Z713 Dietary counseling and surveillance: Secondary | ICD-10-CM | POA: Diagnosis not present

## 2020-08-22 DIAGNOSIS — E669 Obesity, unspecified: Secondary | ICD-10-CM | POA: Diagnosis not present

## 2020-08-30 DIAGNOSIS — M531 Cervicobrachial syndrome: Secondary | ICD-10-CM | POA: Diagnosis not present

## 2020-08-30 DIAGNOSIS — M9902 Segmental and somatic dysfunction of thoracic region: Secondary | ICD-10-CM | POA: Diagnosis not present

## 2020-08-30 DIAGNOSIS — M9901 Segmental and somatic dysfunction of cervical region: Secondary | ICD-10-CM | POA: Diagnosis not present

## 2020-08-30 DIAGNOSIS — M791 Myalgia, unspecified site: Secondary | ICD-10-CM | POA: Diagnosis not present

## 2020-09-05 DIAGNOSIS — E669 Obesity, unspecified: Secondary | ICD-10-CM | POA: Diagnosis not present

## 2020-09-05 DIAGNOSIS — Z713 Dietary counseling and surveillance: Secondary | ICD-10-CM | POA: Diagnosis not present

## 2020-09-19 DIAGNOSIS — E669 Obesity, unspecified: Secondary | ICD-10-CM | POA: Diagnosis not present

## 2020-09-19 DIAGNOSIS — Z713 Dietary counseling and surveillance: Secondary | ICD-10-CM | POA: Diagnosis not present

## 2020-10-02 DIAGNOSIS — M791 Myalgia, unspecified site: Secondary | ICD-10-CM | POA: Diagnosis not present

## 2020-10-02 DIAGNOSIS — M9902 Segmental and somatic dysfunction of thoracic region: Secondary | ICD-10-CM | POA: Diagnosis not present

## 2020-10-02 DIAGNOSIS — M9901 Segmental and somatic dysfunction of cervical region: Secondary | ICD-10-CM | POA: Diagnosis not present

## 2020-10-02 DIAGNOSIS — M531 Cervicobrachial syndrome: Secondary | ICD-10-CM | POA: Diagnosis not present

## 2020-10-03 DIAGNOSIS — E669 Obesity, unspecified: Secondary | ICD-10-CM | POA: Diagnosis not present

## 2020-10-14 ENCOUNTER — Other Ambulatory Visit: Payer: Self-pay | Admitting: Family Medicine

## 2020-11-04 DIAGNOSIS — M9902 Segmental and somatic dysfunction of thoracic region: Secondary | ICD-10-CM | POA: Diagnosis not present

## 2020-11-04 DIAGNOSIS — M791 Myalgia, unspecified site: Secondary | ICD-10-CM | POA: Diagnosis not present

## 2020-11-04 DIAGNOSIS — L814 Other melanin hyperpigmentation: Secondary | ICD-10-CM | POA: Diagnosis not present

## 2020-11-04 DIAGNOSIS — L821 Other seborrheic keratosis: Secondary | ICD-10-CM | POA: Diagnosis not present

## 2020-11-04 DIAGNOSIS — C44519 Basal cell carcinoma of skin of other part of trunk: Secondary | ICD-10-CM | POA: Diagnosis not present

## 2020-11-04 DIAGNOSIS — B078 Other viral warts: Secondary | ICD-10-CM | POA: Diagnosis not present

## 2020-11-04 DIAGNOSIS — D225 Melanocytic nevi of trunk: Secondary | ICD-10-CM | POA: Diagnosis not present

## 2020-11-04 DIAGNOSIS — L570 Actinic keratosis: Secondary | ICD-10-CM | POA: Diagnosis not present

## 2020-11-04 DIAGNOSIS — M531 Cervicobrachial syndrome: Secondary | ICD-10-CM | POA: Diagnosis not present

## 2020-11-04 DIAGNOSIS — D485 Neoplasm of uncertain behavior of skin: Secondary | ICD-10-CM | POA: Diagnosis not present

## 2020-11-04 DIAGNOSIS — M9901 Segmental and somatic dysfunction of cervical region: Secondary | ICD-10-CM | POA: Diagnosis not present

## 2020-11-13 ENCOUNTER — Telehealth: Payer: Self-pay | Admitting: Family Medicine

## 2020-11-13 MED ORDER — LEVOTHYROXINE SODIUM 50 MCG PO TABS: 50.0000 ug | ORAL_TABLET | Freq: Every day | ORAL | 3 refills | Status: DC

## 2020-11-13 NOTE — Telephone Encounter (Signed)
Pt call and stated she need a refill on levothyroxine (SYNTHROID) 50 MCG tablet sent to  [facility], [place] Phone:  [phone]  Fax:  [phone]

## 2020-11-13 NOTE — Telephone Encounter (Signed)
Rx has been sent to the requested pharmacy. Spoke with the patient and she is aware.

## 2020-11-21 DIAGNOSIS — C44519 Basal cell carcinoma of skin of other part of trunk: Secondary | ICD-10-CM | POA: Diagnosis not present

## 2020-11-29 DIAGNOSIS — M531 Cervicobrachial syndrome: Secondary | ICD-10-CM | POA: Diagnosis not present

## 2020-11-29 DIAGNOSIS — M9901 Segmental and somatic dysfunction of cervical region: Secondary | ICD-10-CM | POA: Diagnosis not present

## 2020-11-29 DIAGNOSIS — M791 Myalgia, unspecified site: Secondary | ICD-10-CM | POA: Diagnosis not present

## 2020-11-29 DIAGNOSIS — M9902 Segmental and somatic dysfunction of thoracic region: Secondary | ICD-10-CM | POA: Diagnosis not present

## 2020-12-07 DIAGNOSIS — Z03818 Encounter for observation for suspected exposure to other biological agents ruled out: Secondary | ICD-10-CM | POA: Diagnosis not present

## 2020-12-07 DIAGNOSIS — Z20822 Contact with and (suspected) exposure to covid-19: Secondary | ICD-10-CM | POA: Diagnosis not present

## 2021-01-02 DIAGNOSIS — M531 Cervicobrachial syndrome: Secondary | ICD-10-CM | POA: Diagnosis not present

## 2021-01-02 DIAGNOSIS — M791 Myalgia, unspecified site: Secondary | ICD-10-CM | POA: Diagnosis not present

## 2021-01-02 DIAGNOSIS — M9902 Segmental and somatic dysfunction of thoracic region: Secondary | ICD-10-CM | POA: Diagnosis not present

## 2021-01-02 DIAGNOSIS — M9901 Segmental and somatic dysfunction of cervical region: Secondary | ICD-10-CM | POA: Diagnosis not present

## 2021-02-12 DIAGNOSIS — M791 Myalgia, unspecified site: Secondary | ICD-10-CM | POA: Diagnosis not present

## 2021-02-12 DIAGNOSIS — M9901 Segmental and somatic dysfunction of cervical region: Secondary | ICD-10-CM | POA: Diagnosis not present

## 2021-02-12 DIAGNOSIS — M9902 Segmental and somatic dysfunction of thoracic region: Secondary | ICD-10-CM | POA: Diagnosis not present

## 2021-02-12 DIAGNOSIS — M531 Cervicobrachial syndrome: Secondary | ICD-10-CM | POA: Diagnosis not present

## 2021-02-20 DIAGNOSIS — B078 Other viral warts: Secondary | ICD-10-CM | POA: Diagnosis not present

## 2021-02-20 DIAGNOSIS — L905 Scar conditions and fibrosis of skin: Secondary | ICD-10-CM | POA: Diagnosis not present

## 2021-02-20 DIAGNOSIS — Z85828 Personal history of other malignant neoplasm of skin: Secondary | ICD-10-CM | POA: Diagnosis not present

## 2021-03-11 DIAGNOSIS — M531 Cervicobrachial syndrome: Secondary | ICD-10-CM | POA: Diagnosis not present

## 2021-03-11 DIAGNOSIS — M791 Myalgia, unspecified site: Secondary | ICD-10-CM | POA: Diagnosis not present

## 2021-03-11 DIAGNOSIS — M9901 Segmental and somatic dysfunction of cervical region: Secondary | ICD-10-CM | POA: Diagnosis not present

## 2021-03-11 DIAGNOSIS — M9902 Segmental and somatic dysfunction of thoracic region: Secondary | ICD-10-CM | POA: Diagnosis not present

## 2021-04-10 DIAGNOSIS — M791 Myalgia, unspecified site: Secondary | ICD-10-CM | POA: Diagnosis not present

## 2021-04-10 DIAGNOSIS — M531 Cervicobrachial syndrome: Secondary | ICD-10-CM | POA: Diagnosis not present

## 2021-04-10 DIAGNOSIS — M9902 Segmental and somatic dysfunction of thoracic region: Secondary | ICD-10-CM | POA: Diagnosis not present

## 2021-04-10 DIAGNOSIS — M9901 Segmental and somatic dysfunction of cervical region: Secondary | ICD-10-CM | POA: Diagnosis not present

## 2021-04-16 ENCOUNTER — Telehealth: Payer: Self-pay | Admitting: Family Medicine

## 2021-04-16 NOTE — Telephone Encounter (Signed)
Patient called for new prescription for levothyroxine (SYNTHROID) 50 MCG tablet. Patient is out of refills and requested it be a 90-day supply in between refills      Please Send to  [place], [place] Phone:  [phone]  Fax:  [phone]      Good callback number is [phone]  Please Advise

## 2021-04-16 NOTE — Telephone Encounter (Signed)
Pt informed that [facility].

## 2021-05-29 DIAGNOSIS — M9902 Segmental and somatic dysfunction of thoracic region: Secondary | ICD-10-CM | POA: Diagnosis not present

## 2021-05-29 DIAGNOSIS — M791 Myalgia, unspecified site: Secondary | ICD-10-CM | POA: Diagnosis not present

## 2021-05-29 DIAGNOSIS — M9901 Segmental and somatic dysfunction of cervical region: Secondary | ICD-10-CM | POA: Diagnosis not present

## 2021-05-29 DIAGNOSIS — M531 Cervicobrachial syndrome: Secondary | ICD-10-CM | POA: Diagnosis not present

## 2021-06-23 DIAGNOSIS — M9901 Segmental and somatic dysfunction of cervical region: Secondary | ICD-10-CM | POA: Diagnosis not present

## 2021-06-23 DIAGNOSIS — M531 Cervicobrachial syndrome: Secondary | ICD-10-CM | POA: Diagnosis not present

## 2021-06-23 DIAGNOSIS — M791 Myalgia, unspecified site: Secondary | ICD-10-CM | POA: Diagnosis not present

## 2021-06-23 DIAGNOSIS — M9902 Segmental and somatic dysfunction of thoracic region: Secondary | ICD-10-CM | POA: Diagnosis not present

## 2021-06-24 DIAGNOSIS — Z01411 Encounter for gynecological examination (general) (routine) with abnormal findings: Secondary | ICD-10-CM | POA: Diagnosis not present

## 2021-06-24 DIAGNOSIS — Z113 Encounter for screening for infections with a predominantly sexual mode of transmission: Secondary | ICD-10-CM | POA: Diagnosis not present

## 2021-06-24 DIAGNOSIS — Z6841 Body Mass Index (BMI) 40.0 and over, adult: Secondary | ICD-10-CM | POA: Diagnosis not present

## 2021-06-24 DIAGNOSIS — Z1231 Encounter for screening mammogram for malignant neoplasm of breast: Secondary | ICD-10-CM | POA: Diagnosis not present

## 2021-06-24 DIAGNOSIS — Z01419 Encounter for gynecological examination (general) (routine) without abnormal findings: Secondary | ICD-10-CM | POA: Diagnosis not present

## 2021-06-24 DIAGNOSIS — Z124 Encounter for screening for malignant neoplasm of cervix: Secondary | ICD-10-CM | POA: Diagnosis not present

## 2021-07-04 ENCOUNTER — Telehealth: Payer: Self-pay

## 2021-07-04 ENCOUNTER — Ambulatory Visit (INDEPENDENT_AMBULATORY_CARE_PROVIDER_SITE_OTHER): Payer: BC Managed Care – PPO | Admitting: Family Medicine

## 2021-07-04 ENCOUNTER — Encounter: Payer: Self-pay | Admitting: Family Medicine

## 2021-07-04 VITALS — BP 134/76 | HR 68 | Temp 98.0°F | Wt 266.8 lb

## 2021-07-04 DIAGNOSIS — Z0000 Encounter for general adult medical examination without abnormal findings: Secondary | ICD-10-CM | POA: Diagnosis not present

## 2021-07-04 DIAGNOSIS — Z23 Encounter for immunization: Secondary | ICD-10-CM | POA: Diagnosis not present

## 2021-07-04 LAB — BASIC METABOLIC PANEL
BUN: 16 mg/dL (ref 6–23)
CO2: 29 mEq/L (ref 19–32)
Calcium: 9.2 mg/dL (ref 8.4–10.5)
Chloride: 103 mEq/L (ref 96–112)
Creatinine, Ser: 0.75 mg/dL (ref 0.40–1.20)
GFR: 88.92 mL/min (ref >60.00)
Glucose, Bld: 105 mg/dL — ABNORMAL HIGH (ref 70–99)
Potassium: 4.3 mEq/L (ref 3.5–5.1)
Sodium: 139 mEq/L (ref 135–145)

## 2021-07-04 LAB — CBC WITH DIFFERENTIAL/PLATELET
Basophils Absolute: 0 10*3/uL (ref 0.0–0.1)
Basophils Relative: 0.5 % (ref 0.0–3.0)
Eosinophils Absolute: 0.1 10*3/uL (ref 0.0–0.7)
Eosinophils Relative: 2.2 % (ref 0.0–5.0)
HCT: 41 % (ref 36.0–46.0)
Hemoglobin: 13.8 g/dL (ref 12.0–15.0)
Lymphocytes Relative: 36.8 % (ref 12.0–46.0)
Lymphs Abs: 2.1 10*3/uL (ref 0.7–4.0)
MCHC: 33.5 g/dL (ref 30.0–36.0)
MCV: 88 fl (ref 78.0–100.0)
Monocytes Absolute: 0.5 10*3/uL (ref 0.1–1.0)
Monocytes Relative: 8.2 % (ref 3.0–12.0)
Neutro Abs: 2.9 10*3/uL (ref 1.4–7.7)
Neutrophils Relative %: 52.3 % (ref 43.0–77.0)
Platelets: 193 10*3/uL (ref 150.0–400.0)
RBC: 4.66 Mil/uL (ref 3.87–5.11)
RDW: 13.7 % (ref 11.5–15.5)
WBC: 5.6 10*3/uL (ref 4.0–10.5)

## 2021-07-04 LAB — HEPATIC FUNCTION PANEL
ALT: 24 U/L (ref 0–35)
AST: 21 U/L (ref 0–37)
Albumin: 4.1 g/dL (ref 3.5–5.2)
Alkaline Phosphatase: 74 U/L (ref 39–117)
Bilirubin, Direct: 0.1 mg/dL (ref 0.0–0.3)
Total Bilirubin: 0.6 mg/dL (ref 0.2–1.2)
Total Protein: 6.6 g/dL (ref 6.0–8.3)

## 2021-07-04 LAB — LIPID PANEL
Cholesterol: 167 mg/dL (ref 0–200)
HDL: 67.5 mg/dL (ref >39.00)
LDL Cholesterol: 80 mg/dL (ref 0–99)
NonHDL: 99.14
Total CHOL/HDL Ratio: 2
Triglycerides: 97 mg/dL (ref 0.0–149.0)
VLDL: 19.4 mg/dL (ref 0.0–40.0)

## 2021-07-04 LAB — HEMOGLOBIN A1C: Hgb A1c MFr Bld: 5.8 % (ref 4.6–6.5)

## 2021-07-04 LAB — TSH: TSH: 3.03 u[IU]/mL (ref 0.35–5.50)

## 2021-07-04 MED ORDER — RYBELSUS 3 MG PO TABS: 3.0000 mg | ORAL_TABLET | Freq: Every day | ORAL | 0 refills | Status: DC

## 2021-07-04 MED ORDER — ACETAZOLAMIDE 125 MG PO TABS: ORAL_TABLET | ORAL | 0 refills | Status: DC

## 2021-07-04 NOTE — Telephone Encounter (Signed)
[facility] RYBELSUS with PA

## 2021-07-04 NOTE — Telephone Encounter (Signed)
Please advise ok for a new Rx?

## 2021-07-04 NOTE — Progress Notes (Signed)
Established Patient Office Visit  Subjective:  Patient ID: Monica Zuniga, female    DOB: 30-Jul-1965  Age: 55 y.o. MRN: [id number]  CC:  Chief Complaint  Patient presents with   Annual Exam    HPI Monica Zuniga presents for physical exam.  She sees GYN for Pap smears and mammograms.  Mammogram up-to-date.  Colonoscopy up-to-date.  She has had previous shingles vaccine.  Does need flu vaccine.  Requesting labs today.  She has longstanding history of obesity.  Comorbidities of osteoarthritis and has history of mild hyperlipidemia.  She would like to consider weight loss medications.  She specifically has interested in [place].  She has not checked on insurance coverage.  Family history-she has a sister generally well.  Father recently diagnosed with melanoma age 16.  Her mother passed away from complications of presumably Alzheimer's dementia.  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  They spend most of her time at [facility].  Non-smoker.  They have 3 children.  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    Past Surgical History:  Procedure Laterality Date   EYE SURGERY     as a baby- surgery for eye problem    JOINT REPLACEMENT     L- replacement- 2011   KNEE ARTHROSCOPY     left knee scope 2005, right 2008   TOTAL KNEE ARTHROPLASTY  06/2012   RIGHT KNEE   TOTAL KNEE ARTHROPLASTY  06/27/2012   Procedure: TOTAL KNEE ARTHROPLASTY;  Surgeon: Kerin Salen, MD;  Location: [place];  Service: Orthopedics;  Laterality: Right;   WISDOM TOOTH EXTRACTION     /w sedation    Family History  Problem Relation Age of Onset   Dementia Mother    Cancer Father        melanoma   Diabetes Paternal Grandmother        type ll   Arthritis Other    Colon cancer Neg Hx     Social History   Socioeconomic History   Marital status: Married    Spouse  name: Not on file   Number of children: Not on file   Years of education: Not on file   Highest education level: Not on file  Occupational History   Not on file  Tobacco Use   Smoking status: Never   Smokeless tobacco: Never  Vaping Use   Vaping Use: Never used  Substance and Sexual Activity   Alcohol use: Yes    Alcohol/week: 4.0 - 6.0 standard drinks    Types: 4 - 6 Shots of liquor per week    Comment: on weekends only   Drug use: No   Sexual activity: Yes  Other Topics Concern   Not on file  Social History Narrative   Not on file   Social Determinants of Health   Financial Resource Strain: Not on file  Food Insecurity: Not on file  Transportation Needs: Not on file  Physical Activity: Not on file  Stress: Not on file  Social Connections: Not on file  Intimate Partner Violence: Not on file    Outpatient Medications Prior to Visit  Medication Sig Dispense Refill   b complex vitamins tablet Take 1 tablet by mouth daily.     GLUCOSAMINE PO Take 1 tablet by mouth 2 (two) times daily.      levothyroxine (SYNTHROID) 50 MCG tablet Take  1 tablet (50 mcg total) by mouth daily. 90 tablet 3   Multiple Vitamin (MULTIVITAMIN WITH MINERALS) TABS Take 1 tablet by mouth daily.     Omega-3 Fatty Acids (FISH OIL PO) Take by mouth.     Probiotic Product (PROBIOTIC DAILY PO) Take 1 tablet by mouth daily.     Vitamin D, Cholecalciferol, 25 MCG (1000 UT) CAPS Take 1 capsule by mouth daily.     acetaZOLAMIDE (DIAMOX) 125 MG tablet Take 1 tablet by mouth twice a day, start 24 hr prior to ascent and discontinue 2-3 days after peak arrival or upon descent 20 tablet 0   No facility-administered medications prior to visit.    Allergies  Allergen Reactions   Shellfish Allergy Other (See Comments)    Numbness     ROS Review of Systems  Constitutional:  Negative for activity change, appetite change, fatigue, fever and unexpected weight change.  HENT:  Negative for ear pain, hearing loss,  sore throat and trouble swallowing.   Eyes:  Negative for visual disturbance.  Respiratory:  Negative for cough and shortness of breath.   Cardiovascular:  Negative for chest pain and palpitations.  Gastrointestinal:  Negative for abdominal pain, blood in stool, constipation and diarrhea.  Endocrine: Negative for polydipsia and polyuria.  Genitourinary:  Negative for dysuria and hematuria.  Musculoskeletal:  Negative for arthralgias, back pain and myalgias.  Skin:  Negative for rash.  Neurological:  Negative for dizziness, syncope and headaches.  Hematological:  Negative for adenopathy.  Psychiatric/Behavioral:  Negative for confusion and dysphoric mood.      Objective:    Physical Exam Constitutional:      Appearance: She is well-developed.  HENT:     Head: Normocephalic and atraumatic.  Eyes:     Pupils: Pupils are equal, round, and reactive to light.  Neck:     Thyroid: No thyromegaly.  Cardiovascular:     Rate and Rhythm: Normal rate and regular rhythm.     Heart sounds: Normal heart sounds. No murmur heard. Pulmonary:     Effort: No respiratory distress.     Breath sounds: Normal breath sounds. No wheezing or rales.  Abdominal:     General: Bowel sounds are normal. There is no distension.     Palpations: Abdomen is soft. There is no mass.     Tenderness: There is no abdominal tenderness. There is no guarding or rebound.  Musculoskeletal:        General: Normal range of motion.     Cervical back: Normal range of motion and neck supple.  Lymphadenopathy:     Cervical: No cervical adenopathy.  Skin:    Findings: No rash.  Neurological:     Mental Status: She is alert and oriented to person, place, and time.     Cranial Nerves: No cranial nerve deficit.  Psychiatric:        Behavior: Behavior normal.        Thought Content: Thought content normal.        Judgment: Judgment normal.    BP 134/76 (BP Location: Left Arm, Patient Position: Sitting, Cuff Size: Normal)    Pulse 68   Temp 98 F (36.7 C) (Oral)   Wt 266 lb 12.8 oz (121 kg)   LMP 03/28/2015   SpO2 94%   BMI 46.52 kg/m  Wt Readings from Last 3 Encounters:  07/04/21 266 lb 12.8 oz (121 kg)  07/01/20 268 lb (121.6 kg)  06/30/19 261 lb 1.6 oz (118.4 kg)  Health Maintenance Due  Topic Date Due   COVID-19 Vaccine (4 - Booster for Pfizer series) 07/26/2020   INFLUENZA VACCINE  02/24/2021   PAP SMEAR-Modifier  06/29/2021    There are no preventive care reminders to display for this patient.  Lab Results  Component Value Date   TSH 2.92 07/01/2020   Lab Results  Component Value Date   WBC 7.1 07/01/2020   HGB 14.6 07/01/2020   HCT 43.8 07/01/2020   MCV 88.8 07/01/2020   PLT 217 07/01/2020   Lab Results  Component Value Date   NA 139 07/01/2020   K 4.7 07/01/2020   CO2 26 07/01/2020   GLUCOSE 89 07/01/2020   BUN 21 07/01/2020   CREATININE 0.70 07/01/2020   BILITOT 0.5 07/01/2020   ALKPHOS 82 06/30/2019   AST 20 07/01/2020   ALT 24 07/01/2020   PROT 7.0 07/01/2020   ALBUMIN 4.4 06/30/2019   CALCIUM 9.6 07/01/2020   GFR 75.69 06/30/2019   Lab Results  Component Value Date   CHOL 171 07/01/2020   Lab Results  Component Value Date   HDL 66 07/01/2020   Lab Results  Component Value Date   LDLCALC 84 07/01/2020   Lab Results  Component Value Date   TRIG 109 07/01/2020   Lab Results  Component Value Date   CHOLHDL 2.6 07/01/2020   Lab Results  Component Value Date   HGBA1C 5.6 06/30/2019      Assessment & Plan:   Problem List Items Addressed This Visit   None Visit Diagnoses     Physical exam    -  Primary   Relevant Orders   Basic metabolic panel   Lipid panel   CBC with Differential/Platelet   TSH   Hepatic function panel   Hemoglobin A1c     -Flu vaccine given -Obtain screening labs -Patient requesting refill of Diamox.  They plan to travel in January and will be at altitude over 10,000 feet. -We discussed weight loss medication  options.  She would like to consider Rybelsus or Wegovy but will check on insurance coverage first.  Meds ordered this encounter  Medications   acetaZOLAMIDE (DIAMOX) 125 MG tablet    Sig: Take 1 tablet by mouth twice a day, start 24 hr prior to ascent and discontinue 2-3 days after peak arrival or upon descent    Dispense:  40 tablet    Refill:  0    Follow-up: No follow-ups on file.    Carolann Littler, MD

## 2021-07-04 NOTE — Telephone Encounter (Signed)
Rx has been sent in. PA sent to cover my meds.   Monica Zuniga (Key: [id number]) Rybelsus 3MG  tablets  Spoke with the patient and she is aware I will be in touch with updates.

## 2021-07-04 NOTE — Addendum Note (Signed)
Addended by: Rebecca Eaton on: 07/04/2021 04:24 PM   Modules accepted: Orders

## 2021-07-08 NOTE — Telephone Encounter (Signed)
Checked on the status of the PA. It is still in review.

## 2021-07-17 DIAGNOSIS — M531 Cervicobrachial syndrome: Secondary | ICD-10-CM | POA: Diagnosis not present

## 2021-07-17 DIAGNOSIS — M9901 Segmental and somatic dysfunction of cervical region: Secondary | ICD-10-CM | POA: Diagnosis not present

## 2021-07-17 DIAGNOSIS — M9902 Segmental and somatic dysfunction of thoracic region: Secondary | ICD-10-CM | POA: Diagnosis not present

## 2021-07-17 DIAGNOSIS — M791 Myalgia, unspecified site: Secondary | ICD-10-CM | POA: Diagnosis not present

## 2021-07-17 MED ORDER — PHENTERMINE HCL 37.5 MG PO CAPS: 37.5000 mg | ORAL_CAPSULE | ORAL | 2 refills | Status: DC

## 2021-07-17 NOTE — Telephone Encounter (Signed)
I sent in the Phentermine prescription with 2 refills.

## 2021-07-17 NOTE — Telephone Encounter (Signed)
Spoke with the patient. She is aware her prescription has been sent in.

## 2021-07-17 NOTE — Addendum Note (Signed)
Addended by: Eulas Post on: 07/17/2021 03:17 PM   Modules accepted: Orders

## 2021-08-20 DIAGNOSIS — M9902 Segmental and somatic dysfunction of thoracic region: Secondary | ICD-10-CM | POA: Diagnosis not present

## 2021-08-20 DIAGNOSIS — M531 Cervicobrachial syndrome: Secondary | ICD-10-CM | POA: Diagnosis not present

## 2021-08-20 DIAGNOSIS — M9901 Segmental and somatic dysfunction of cervical region: Secondary | ICD-10-CM | POA: Diagnosis not present

## 2021-08-20 DIAGNOSIS — M791 Myalgia, unspecified site: Secondary | ICD-10-CM | POA: Diagnosis not present

## 2021-09-16 DIAGNOSIS — M791 Myalgia, unspecified site: Secondary | ICD-10-CM | POA: Diagnosis not present

## 2021-09-16 DIAGNOSIS — M9902 Segmental and somatic dysfunction of thoracic region: Secondary | ICD-10-CM | POA: Diagnosis not present

## 2021-09-16 DIAGNOSIS — M9901 Segmental and somatic dysfunction of cervical region: Secondary | ICD-10-CM | POA: Diagnosis not present

## 2021-09-16 DIAGNOSIS — M531 Cervicobrachial syndrome: Secondary | ICD-10-CM | POA: Diagnosis not present

## 2021-11-06 DIAGNOSIS — Z08 Encounter for follow-up examination after completed treatment for malignant neoplasm: Secondary | ICD-10-CM | POA: Diagnosis not present

## 2021-11-06 DIAGNOSIS — D225 Melanocytic nevi of trunk: Secondary | ICD-10-CM | POA: Diagnosis not present

## 2021-11-06 DIAGNOSIS — L821 Other seborrheic keratosis: Secondary | ICD-10-CM | POA: Diagnosis not present

## 2021-11-06 DIAGNOSIS — L814 Other melanin hyperpigmentation: Secondary | ICD-10-CM | POA: Diagnosis not present

## 2021-11-13 DIAGNOSIS — M9901 Segmental and somatic dysfunction of cervical region: Secondary | ICD-10-CM | POA: Diagnosis not present

## 2021-11-13 DIAGNOSIS — M9902 Segmental and somatic dysfunction of thoracic region: Secondary | ICD-10-CM | POA: Diagnosis not present

## 2021-11-13 DIAGNOSIS — M531 Cervicobrachial syndrome: Secondary | ICD-10-CM | POA: Diagnosis not present

## 2021-11-13 DIAGNOSIS — M791 Myalgia, unspecified site: Secondary | ICD-10-CM | POA: Diagnosis not present

## 2021-12-11 DIAGNOSIS — R0781 Pleurodynia: Secondary | ICD-10-CM | POA: Diagnosis not present

## 2021-12-11 DIAGNOSIS — M9901 Segmental and somatic dysfunction of cervical region: Secondary | ICD-10-CM | POA: Diagnosis not present

## 2021-12-11 DIAGNOSIS — R1012 Left upper quadrant pain: Secondary | ICD-10-CM | POA: Diagnosis not present

## 2021-12-11 DIAGNOSIS — M9902 Segmental and somatic dysfunction of thoracic region: Secondary | ICD-10-CM | POA: Diagnosis not present

## 2021-12-11 DIAGNOSIS — M531 Cervicobrachial syndrome: Secondary | ICD-10-CM | POA: Diagnosis not present

## 2021-12-11 DIAGNOSIS — M791 Myalgia, unspecified site: Secondary | ICD-10-CM | POA: Diagnosis not present

## 2021-12-12 ENCOUNTER — Ambulatory Visit
Admission: RE | Admit: 2021-12-12 | Discharge: 2021-12-12 | Disposition: A | Discharge status: Home / Self Care | Payer: BC Managed Care – PPO | Source: Ambulatory Visit | Attending: Obstetrics & Gynecology | Admitting: Obstetrics & Gynecology

## 2021-12-12 ENCOUNTER — Other Ambulatory Visit: Payer: Self-pay | Admitting: Obstetrics & Gynecology

## 2021-12-12 DIAGNOSIS — R0781 Pleurodynia: Secondary | ICD-10-CM

## 2021-12-24 ENCOUNTER — Other Ambulatory Visit: Payer: Self-pay | Admitting: Family Medicine

## 2022-01-08 DIAGNOSIS — M791 Myalgia, unspecified site: Secondary | ICD-10-CM | POA: Diagnosis not present

## 2022-01-08 DIAGNOSIS — M9901 Segmental and somatic dysfunction of cervical region: Secondary | ICD-10-CM | POA: Diagnosis not present

## 2022-01-08 DIAGNOSIS — M531 Cervicobrachial syndrome: Secondary | ICD-10-CM | POA: Diagnosis not present

## 2022-01-08 DIAGNOSIS — M9902 Segmental and somatic dysfunction of thoracic region: Secondary | ICD-10-CM | POA: Diagnosis not present

## 2022-01-09 DIAGNOSIS — H0288B Meibomian gland dysfunction left eye, upper and lower eyelids: Secondary | ICD-10-CM | POA: Diagnosis not present

## 2022-01-09 DIAGNOSIS — H16223 Keratoconjunctivitis sicca, not specified as Sjogren's, bilateral: Secondary | ICD-10-CM | POA: Diagnosis not present

## 2022-01-09 DIAGNOSIS — H0288A Meibomian gland dysfunction right eye, upper and lower eyelids: Secondary | ICD-10-CM | POA: Diagnosis not present

## 2022-01-29 ENCOUNTER — Ambulatory Visit (INDEPENDENT_AMBULATORY_CARE_PROVIDER_SITE_OTHER): Payer: BC Managed Care – PPO

## 2022-01-29 ENCOUNTER — Encounter: Payer: Self-pay | Admitting: Podiatry

## 2022-01-29 ENCOUNTER — Ambulatory Visit (INDEPENDENT_AMBULATORY_CARE_PROVIDER_SITE_OTHER): Payer: BC Managed Care – PPO | Admitting: Podiatry

## 2022-01-29 DIAGNOSIS — M19071 Primary osteoarthritis, right ankle and foot: Secondary | ICD-10-CM

## 2022-01-29 DIAGNOSIS — M7751 Other enthesopathy of right foot: Secondary | ICD-10-CM

## 2022-01-29 DIAGNOSIS — M778 Other enthesopathies, not elsewhere classified: Secondary | ICD-10-CM

## 2022-01-29 DIAGNOSIS — M7752 Other enthesopathy of left foot: Secondary | ICD-10-CM

## 2022-01-29 MED ORDER — MELOXICAM 7.5 MG PO TABS: 7.5000 mg | ORAL_TABLET | Freq: Every day | ORAL | 0 refills | Status: DC

## 2022-01-29 NOTE — Progress Notes (Signed)
Subjective:  Patient ID: Monica Zuniga, female    DOB: 23-Feb-1966,  MRN: [id number] HPI Chief Complaint  Patient presents with   Foot Pain    Medial/dorsal midfoot right - injury years ago, limited ROM, noticed pain with increased activity, takes Motrin PRN and ices   New Patient (Initial Visit)    56 y.o. female presents with the above complaint.   ROS: Denies fever chills nausea vomiting muscle aches pains calf pain back pain chest pain shortness of breath.  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   Past Surgical History:  Procedure Laterality Date   EYE SURGERY     as a baby- surgery for eye problem    JOINT REPLACEMENT     L- replacement- 2011   KNEE ARTHROSCOPY     left knee scope 2005, right 2008   TOTAL KNEE ARTHROPLASTY  06/2012   RIGHT KNEE   TOTAL KNEE ARTHROPLASTY  06/27/2012   Procedure: TOTAL KNEE ARTHROPLASTY;  Surgeon: Kerin Salen, MD;  Location: [place];  Service: Orthopedics;  Laterality: Right;   WISDOM TOOTH EXTRACTION     /w sedation    Current Outpatient Medications:    meloxicam (MOBIC) 7.5 MG tablet, Take 1 tablet (7.5 mg total) by mouth daily., Disp: 30 tablet, Rfl: 0   azelastine (OPTIVAR) 0.05 % ophthalmic solution, , Disp: , Rfl:    b complex vitamins tablet, Take 1 tablet by mouth daily., Disp: , Rfl:    cyclobenzaprine (FLEXERIL) 5 MG tablet, Take 5 mg by mouth 3 (three) times daily., Disp: , Rfl:    GLUCOSAMINE PO, Take 1 tablet by mouth 2 (two) times daily. , Disp: , Rfl:    levothyroxine (SYNTHROID) 50 MCG tablet, TAKE 1 TABLET DAILY, Disp: 90 tablet, Rfl: 2   Multiple Vitamin (MULTIVITAMIN WITH MINERALS) TABS, Take 1 tablet by mouth daily., Disp: , Rfl:    Omega-3 Fatty Acids (FISH OIL PO), Take by mouth., Disp: , Rfl:    Probiotic Product (PROBIOTIC DAILY PO), Take 1 tablet by mouth daily., Disp: , Rfl:    Vitamin D, Cholecalciferol, 25 MCG (1000 UT) CAPS, Take 1 capsule by mouth daily.,  Disp: , Rfl:    XIIDRA 5 % SOLN, Apply 1 drop to eye 2 (two) times daily., Disp: , Rfl:   Allergies  Allergen Reactions   Shellfish Allergy Other (See Comments)    Numbness    Review of Systems Objective:  There were no vitals filed for this visit.  General: Well developed, nourished, in no acute distress, alert and oriented x3   Dermatological: Skin is warm, dry and supple bilateral. Nails x 10 are well maintained; remaining integument appears unremarkable at this time. There are no open sores, no preulcerative lesions, no rash or signs of infection present.  Vascular: Dorsalis Pedis artery and Posterior Tibial artery pedal pulses are 2/4 bilateral with immedate capillary fill time. Pedal hair growth present. No varicosities and no lower extremity edema present bilateral.   Neruologic: Grossly intact via light touch bilateral. Vibratory intact via tuning fork bilateral. Protective threshold with Semmes Wienstein monofilament intact to all pedal sites bilateral. Patellar and Achilles deep tendon reflexes 2+ bilateral. No Babinski or clonus noted bilateral.   Musculoskeletal: No gross boney pedal deformities bilateral. No pain, crepitus, or limitation noted with foot and ankle range of motion bilateral. Muscular strength 5/5 in all groups tested bilateral.  Pain on palpation and range of motion of the navicular cuneiform  joint right arch.  Gait: Unassisted, Nonantalgic.    Radiographs:  Radiographs taken today demonstrate an osseously mature foot with joint space narrowing subchondral sclerosis of the navicular cuneiform joint and talonavicular joint as well.  Assessment & Plan:   Assessment: Osteoarthritis midfoot right  Plan: I recommended orthotics and topical anti-inflammatory such as Voltaren cream.     Jamario Colina T. [place], [place]

## 2022-02-11 ENCOUNTER — Other Ambulatory Visit: Payer: BC Managed Care – PPO

## 2022-02-18 ENCOUNTER — Ambulatory Visit (INDEPENDENT_AMBULATORY_CARE_PROVIDER_SITE_OTHER): Payer: BC Managed Care – PPO

## 2022-02-18 DIAGNOSIS — M19071 Primary osteoarthritis, right ankle and foot: Secondary | ICD-10-CM

## 2022-02-18 DIAGNOSIS — M7752 Other enthesopathy of left foot: Secondary | ICD-10-CM | POA: Diagnosis not present

## 2022-02-18 DIAGNOSIS — M778 Other enthesopathies, not elsewhere classified: Secondary | ICD-10-CM | POA: Diagnosis not present

## 2022-02-18 DIAGNOSIS — M791 Myalgia, unspecified site: Secondary | ICD-10-CM | POA: Diagnosis not present

## 2022-02-18 DIAGNOSIS — M9901 Segmental and somatic dysfunction of cervical region: Secondary | ICD-10-CM | POA: Diagnosis not present

## 2022-02-18 DIAGNOSIS — M9902 Segmental and somatic dysfunction of thoracic region: Secondary | ICD-10-CM | POA: Diagnosis not present

## 2022-02-18 DIAGNOSIS — M531 Cervicobrachial syndrome: Secondary | ICD-10-CM | POA: Diagnosis not present

## 2022-02-18 NOTE — Progress Notes (Signed)
Patient presents today to be casted for custom molded orthotics. Dr. Milinda Pointer has been treating patient for capsulitis.   Impression foam cast was taken. ABN signed.  Patient info-  Shoe size: 8 women's medium   Shoe style: athletic and causal shoes  Height: 5'4"  Weight: 250lbs   Patient will be notified once orthotics arrive in office and reappoint for fitting at that time.

## 2022-02-25 ENCOUNTER — Other Ambulatory Visit: Payer: Self-pay | Admitting: Podiatry

## 2022-03-19 DIAGNOSIS — M791 Myalgia, unspecified site: Secondary | ICD-10-CM | POA: Diagnosis not present

## 2022-03-19 DIAGNOSIS — M531 Cervicobrachial syndrome: Secondary | ICD-10-CM | POA: Diagnosis not present

## 2022-03-19 DIAGNOSIS — M9902 Segmental and somatic dysfunction of thoracic region: Secondary | ICD-10-CM | POA: Diagnosis not present

## 2022-03-19 DIAGNOSIS — M9901 Segmental and somatic dysfunction of cervical region: Secondary | ICD-10-CM | POA: Diagnosis not present

## 2022-03-19 DIAGNOSIS — H16223 Keratoconjunctivitis sicca, not specified as Sjogren's, bilateral: Secondary | ICD-10-CM | POA: Diagnosis not present

## 2022-03-19 DIAGNOSIS — H0288A Meibomian gland dysfunction right eye, upper and lower eyelids: Secondary | ICD-10-CM | POA: Diagnosis not present

## 2022-03-19 DIAGNOSIS — H0288B Meibomian gland dysfunction left eye, upper and lower eyelids: Secondary | ICD-10-CM | POA: Diagnosis not present

## 2022-03-23 ENCOUNTER — Telehealth: Payer: Self-pay | Admitting: Podiatry

## 2022-03-23 NOTE — Telephone Encounter (Signed)
Left message  for opu

## 2022-03-31 ENCOUNTER — Ambulatory Visit: Payer: BC Managed Care – PPO | Admitting: *Deleted

## 2022-03-31 ENCOUNTER — Other Ambulatory Visit: Payer: Self-pay | Admitting: Podiatry

## 2022-03-31 DIAGNOSIS — M778 Other enthesopathies, not elsewhere classified: Secondary | ICD-10-CM

## 2022-03-31 NOTE — Progress Notes (Signed)
Patient presents today to pick up custom molded foot orthotics, diagnosed with capsulitis by Dr. Milinda Pointer.   Orthotics were dispensed and fit was satisfactory. Reviewed instructions for break-in and wear. Written instructions given to patient.  Patient will follow up as needed.   [facility] - order # [id number]

## 2022-05-12 DIAGNOSIS — M9901 Segmental and somatic dysfunction of cervical region: Secondary | ICD-10-CM | POA: Diagnosis not present

## 2022-05-12 DIAGNOSIS — M791 Myalgia, unspecified site: Secondary | ICD-10-CM | POA: Diagnosis not present

## 2022-05-12 DIAGNOSIS — M531 Cervicobrachial syndrome: Secondary | ICD-10-CM | POA: Diagnosis not present

## 2022-05-12 DIAGNOSIS — M9902 Segmental and somatic dysfunction of thoracic region: Secondary | ICD-10-CM | POA: Diagnosis not present

## 2022-06-08 DIAGNOSIS — M9901 Segmental and somatic dysfunction of cervical region: Secondary | ICD-10-CM | POA: Diagnosis not present

## 2022-06-08 DIAGNOSIS — M9902 Segmental and somatic dysfunction of thoracic region: Secondary | ICD-10-CM | POA: Diagnosis not present

## 2022-06-08 DIAGNOSIS — M531 Cervicobrachial syndrome: Secondary | ICD-10-CM | POA: Diagnosis not present

## 2022-06-08 DIAGNOSIS — M791 Myalgia, unspecified site: Secondary | ICD-10-CM | POA: Diagnosis not present

## 2022-07-06 DIAGNOSIS — H0288B Meibomian gland dysfunction left eye, upper and lower eyelids: Secondary | ICD-10-CM | POA: Diagnosis not present

## 2022-07-06 DIAGNOSIS — H16223 Keratoconjunctivitis sicca, not specified as Sjogren's, bilateral: Secondary | ICD-10-CM | POA: Diagnosis not present

## 2022-07-06 DIAGNOSIS — H0288A Meibomian gland dysfunction right eye, upper and lower eyelids: Secondary | ICD-10-CM | POA: Diagnosis not present

## 2022-07-07 ENCOUNTER — Encounter: Payer: Self-pay | Admitting: Family Medicine

## 2022-07-07 ENCOUNTER — Ambulatory Visit (INDEPENDENT_AMBULATORY_CARE_PROVIDER_SITE_OTHER): Payer: BC Managed Care – PPO | Admitting: Family Medicine

## 2022-07-07 VITALS — BP 110/76 | HR 93 | Temp 98.0°F | Ht 64.17 in | Wt 272.2 lb

## 2022-07-07 DIAGNOSIS — Z1231 Encounter for screening mammogram for malignant neoplasm of breast: Secondary | ICD-10-CM | POA: Diagnosis not present

## 2022-07-07 DIAGNOSIS — M791 Myalgia, unspecified site: Secondary | ICD-10-CM | POA: Diagnosis not present

## 2022-07-07 DIAGNOSIS — M9902 Segmental and somatic dysfunction of thoracic region: Secondary | ICD-10-CM | POA: Diagnosis not present

## 2022-07-07 DIAGNOSIS — Z0000 Encounter for general adult medical examination without abnormal findings: Secondary | ICD-10-CM

## 2022-07-07 DIAGNOSIS — Z23 Encounter for immunization: Secondary | ICD-10-CM

## 2022-07-07 DIAGNOSIS — Z01419 Encounter for gynecological examination (general) (routine) without abnormal findings: Secondary | ICD-10-CM | POA: Diagnosis not present

## 2022-07-07 DIAGNOSIS — M531 Cervicobrachial syndrome: Secondary | ICD-10-CM | POA: Diagnosis not present

## 2022-07-07 DIAGNOSIS — R7401 Elevation of levels of liver transaminase levels: Secondary | ICD-10-CM

## 2022-07-07 DIAGNOSIS — Z6841 Body Mass Index (BMI) 40.0 and over, adult: Secondary | ICD-10-CM | POA: Diagnosis not present

## 2022-07-07 DIAGNOSIS — M9901 Segmental and somatic dysfunction of cervical region: Secondary | ICD-10-CM | POA: Diagnosis not present

## 2022-07-07 LAB — TSH: TSH: 4.49 u[IU]/mL (ref 0.35–5.50)

## 2022-07-07 LAB — HEPATIC FUNCTION PANEL
ALT: 42 U/L — ABNORMAL HIGH (ref 0–35)
AST: 36 U/L (ref 0–37)
Albumin: 4.6 g/dL (ref 3.5–5.2)
Alkaline Phosphatase: 80 U/L (ref 39–117)
Bilirubin, Direct: 0.1 mg/dL (ref 0.0–0.3)
Total Bilirubin: 0.6 mg/dL (ref 0.2–1.2)
Total Protein: 7.3 g/dL (ref 6.0–8.3)

## 2022-07-07 LAB — CBC WITH DIFFERENTIAL/PLATELET
Basophils Absolute: 0 10*3/uL (ref 0.0–0.1)
Basophils Relative: 0.6 % (ref 0.0–3.0)
Eosinophils Absolute: 0.1 10*3/uL (ref 0.0–0.7)
Eosinophils Relative: 1.7 % (ref 0.0–5.0)
HCT: 42.4 % (ref 36.0–46.0)
Hemoglobin: 14.4 g/dL (ref 12.0–15.0)
Lymphocytes Relative: 34.9 % (ref 12.0–46.0)
Lymphs Abs: 2.1 10*3/uL (ref 0.7–4.0)
MCHC: 34 g/dL (ref 30.0–36.0)
MCV: 90 fl (ref 78.0–100.0)
Monocytes Absolute: 0.5 10*3/uL (ref 0.1–1.0)
Monocytes Relative: 7.8 % (ref 3.0–12.0)
Neutro Abs: 3.3 10*3/uL (ref 1.4–7.7)
Neutrophils Relative %: 55 % (ref 43.0–77.0)
Platelets: 210 10*3/uL (ref 150.0–400.0)
RBC: 4.72 Mil/uL (ref 3.87–5.11)
RDW: 13.8 % (ref 11.5–15.5)
WBC: 5.9 10*3/uL (ref 4.0–10.5)

## 2022-07-07 LAB — LIPID PANEL
Cholesterol: 205 mg/dL — ABNORMAL HIGH (ref 0–200)
HDL: 71.3 mg/dL (ref >39.00)
LDL Cholesterol: 117 mg/dL — ABNORMAL HIGH (ref 0–99)
NonHDL: 133.52
Total CHOL/HDL Ratio: 3
Triglycerides: 81 mg/dL (ref 0.0–149.0)
VLDL: 16.2 mg/dL (ref 0.0–40.0)

## 2022-07-07 LAB — BASIC METABOLIC PANEL
BUN: 17 mg/dL (ref 6–23)
CO2: 30 mEq/L (ref 19–32)
Calcium: 9.8 mg/dL (ref 8.4–10.5)
Chloride: 100 mEq/L (ref 96–112)
Creatinine, Ser: 0.79 mg/dL (ref 0.40–1.20)
GFR: 82.96 mL/min (ref >60.00)
Glucose, Bld: 99 mg/dL (ref 70–99)
Potassium: 4.4 mEq/L (ref 3.5–5.1)
Sodium: 139 mEq/L (ref 135–145)

## 2022-07-07 LAB — HM MAMMOGRAPHY

## 2022-07-07 LAB — HEMOGLOBIN A1C: Hgb A1c MFr Bld: 5.8 % (ref 4.6–6.5)

## 2022-07-07 NOTE — Addendum Note (Signed)
Addended by: Nilda Riggs on: 07/07/2022 10:51 AM   Modules accepted: Orders

## 2022-07-07 NOTE — Progress Notes (Signed)
Established Patient Office Visit  Subjective   Patient ID: Monica Zuniga, female    DOB: [birth year]  Age: 56 y.o. MRN: [id number]  No chief complaint on file.   HPI   Monica Zuniga is seen for physical exam.  She sees GYN yearly and in fact has appointment later today for mammogram.  Her last Pap smear was couple years ago.  She also is followed by dermatology yearly.  Her father has history of melanoma on the scalp.  She is generally doing well.  She had inquired about Wegovy for obesity treatment last year but insurance would not cover in the absence of diabetes.  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  She is considering retirement a year from January.  She is married.  3 children.  Non-smoker.  They own a townhome here in [place] and also have home [place] is alive age 75.  History of melanoma.  Her mom died of complications of Alzheimer's disease.  She has a sister who is well.  No family history of diabetes or premature CAD.  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 She has never  used smokeless tobacco. She reports current alcohol use of about 4.0 - 6.0 standard drinks of alcohol per week. She reports that she does not use drugs. family history includes Arthritis in an other family member; Cancer in her father; Dementia in her mother; Diabetes in her paternal grandmother. Allergies  Allergen Reactions   Shellfish Allergy Other (See Comments)    Numbness     Review of Systems  Constitutional:  Negative for chills, fever, malaise/fatigue and weight loss.  HENT:  Negative for hearing loss.   Eyes:  Negative for blurred vision and double vision.  Respiratory:  Negative for cough and shortness of breath.   Cardiovascular:  Negative for chest pain, palpitations and leg swelling.  Gastrointestinal:  Negative for abdominal pain, blood in stool, constipation and diarrhea.  Genitourinary:  Negative for dysuria.  Skin:  Negative for rash.  Neurological:  Negative for dizziness, speech change, seizures, loss of consciousness and headaches.  Psychiatric/Behavioral:  Negative for depression.       Objective:     BP 110/76 (BP Location: Left Arm, Patient Position: Sitting, Cuff Size: Large)   Pulse 93   Temp 98 F (36.7 C) (Oral)   Ht 5' 4.17" (1.63 m)   Wt 272 lb 3.2 oz (123.5 kg)   LMP 03/28/2015   SpO2 98%   BMI 46.47 kg/m  BP Readings from Last 3 Encounters:  07/07/22 110/76  07/04/21 134/76  07/01/20 127/68   Wt Readings from Last 3 Encounters:  07/07/22 272 lb 3.2 oz (123.5 kg)  07/04/21 266 lb 12.8 oz (121 kg)  07/01/20 268 lb (121.6 kg)      Physical Exam Vitals reviewed.  Constitutional:      Appearance: She is well-developed.  HENT:     Head: Normocephalic and atraumatic.  Eyes:     Pupils: Pupils are equal, round, and reactive to light.  Neck:     Thyroid: No thyromegaly.  Cardiovascular:     Rate and Rhythm: Normal rate and regular rhythm.     Heart sounds: Normal heart sounds. No murmur heard. Pulmonary:     Effort: No respiratory  distress.     Breath sounds: Normal breath sounds. No wheezing or rales.  Abdominal:     General: Bowel sounds are normal. There is no distension.     Palpations: Abdomen is soft. There is no mass.     Tenderness: There is no abdominal tenderness. There is no guarding or rebound.  Musculoskeletal:        General: Normal range of motion.     Cervical back: Normal range of motion and neck supple.  Lymphadenopathy:     Cervical: No cervical adenopathy.  Skin:    Findings: No rash.  Neurological:     Mental Status: She is alert and oriented to person, place, and time.     Cranial Nerves: No cranial nerve deficit.  Psychiatric:        Behavior: Behavior normal.        Thought Content: Thought content normal.        Judgment: Judgment normal.      No results found for any visits on 07/07/22.    The 10-year ASCVD risk score (Arnett DK, et al., 2019) is: 1.3%    Assessment & Plan:   Problem List Items Addressed This Visit   None Visit Diagnoses     Physical exam    -  Primary   Relevant Orders   Basic metabolic panel   Lipid panel   CBC with Differential/Platelet   TSH   Hepatic function panel   Hemoglobin A1c     We discussed several health maintenance items as follows  -Flu vaccine recommended and given -Continue with yearly mammograms through GYN -Colonoscopy up-to-date as above -Obtain follow-up screening labs.  Include A1c. -Try to lose some weight. -Continue regular follow-up with Derm especially with her family history of melanoma  No follow-ups on file.    Carolann Littler, MD

## 2022-07-08 NOTE — Addendum Note (Signed)
Addended by: Nilda Riggs on: 07/08/2022 04:47 PM   Modules accepted: Orders

## 2022-07-10 ENCOUNTER — Encounter: Payer: Self-pay | Admitting: Family Medicine

## 2022-08-05 DIAGNOSIS — M791 Myalgia, unspecified site: Secondary | ICD-10-CM | POA: Diagnosis not present

## 2022-08-05 DIAGNOSIS — M9902 Segmental and somatic dysfunction of thoracic region: Secondary | ICD-10-CM | POA: Diagnosis not present

## 2022-08-05 DIAGNOSIS — M9901 Segmental and somatic dysfunction of cervical region: Secondary | ICD-10-CM | POA: Diagnosis not present

## 2022-08-05 DIAGNOSIS — M531 Cervicobrachial syndrome: Secondary | ICD-10-CM | POA: Diagnosis not present

## 2022-09-01 DIAGNOSIS — M531 Cervicobrachial syndrome: Secondary | ICD-10-CM | POA: Diagnosis not present

## 2022-09-01 DIAGNOSIS — M791 Myalgia, unspecified site: Secondary | ICD-10-CM | POA: Diagnosis not present

## 2022-09-01 DIAGNOSIS — M9901 Segmental and somatic dysfunction of cervical region: Secondary | ICD-10-CM | POA: Diagnosis not present

## 2022-09-01 DIAGNOSIS — M9902 Segmental and somatic dysfunction of thoracic region: Secondary | ICD-10-CM | POA: Diagnosis not present

## 2022-09-21 ENCOUNTER — Other Ambulatory Visit: Payer: Self-pay | Admitting: Family Medicine

## 2022-09-28 DIAGNOSIS — M9902 Segmental and somatic dysfunction of thoracic region: Secondary | ICD-10-CM | POA: Diagnosis not present

## 2022-09-28 DIAGNOSIS — M9901 Segmental and somatic dysfunction of cervical region: Secondary | ICD-10-CM | POA: Diagnosis not present

## 2022-09-28 DIAGNOSIS — M531 Cervicobrachial syndrome: Secondary | ICD-10-CM | POA: Diagnosis not present

## 2022-09-28 DIAGNOSIS — M791 Myalgia, unspecified site: Secondary | ICD-10-CM | POA: Diagnosis not present

## 2022-10-19 DIAGNOSIS — M25531 Pain in right wrist: Secondary | ICD-10-CM | POA: Diagnosis not present

## 2022-10-19 DIAGNOSIS — M25831 Other specified joint disorders, right wrist: Secondary | ICD-10-CM | POA: Diagnosis not present

## 2022-10-27 DIAGNOSIS — M791 Myalgia, unspecified site: Secondary | ICD-10-CM | POA: Diagnosis not present

## 2022-10-27 DIAGNOSIS — M531 Cervicobrachial syndrome: Secondary | ICD-10-CM | POA: Diagnosis not present

## 2022-10-27 DIAGNOSIS — M9901 Segmental and somatic dysfunction of cervical region: Secondary | ICD-10-CM | POA: Diagnosis not present

## 2022-10-27 DIAGNOSIS — M9902 Segmental and somatic dysfunction of thoracic region: Secondary | ICD-10-CM | POA: Diagnosis not present

## 2022-11-18 DIAGNOSIS — L814 Other melanin hyperpigmentation: Secondary | ICD-10-CM | POA: Diagnosis not present

## 2022-11-18 DIAGNOSIS — L821 Other seborrheic keratosis: Secondary | ICD-10-CM | POA: Diagnosis not present

## 2022-11-18 DIAGNOSIS — Z08 Encounter for follow-up examination after completed treatment for malignant neoplasm: Secondary | ICD-10-CM | POA: Diagnosis not present

## 2022-11-18 DIAGNOSIS — D225 Melanocytic nevi of trunk: Secondary | ICD-10-CM | POA: Diagnosis not present

## 2022-11-18 DIAGNOSIS — L820 Inflamed seborrheic keratosis: Secondary | ICD-10-CM | POA: Diagnosis not present

## 2022-11-24 DIAGNOSIS — M9901 Segmental and somatic dysfunction of cervical region: Secondary | ICD-10-CM | POA: Diagnosis not present

## 2022-11-24 DIAGNOSIS — M9902 Segmental and somatic dysfunction of thoracic region: Secondary | ICD-10-CM | POA: Diagnosis not present

## 2022-11-24 DIAGNOSIS — M791 Myalgia, unspecified site: Secondary | ICD-10-CM | POA: Diagnosis not present

## 2022-11-24 DIAGNOSIS — M531 Cervicobrachial syndrome: Secondary | ICD-10-CM | POA: Diagnosis not present

## 2022-12-17 DIAGNOSIS — M9901 Segmental and somatic dysfunction of cervical region: Secondary | ICD-10-CM | POA: Diagnosis not present

## 2022-12-17 DIAGNOSIS — M9902 Segmental and somatic dysfunction of thoracic region: Secondary | ICD-10-CM | POA: Diagnosis not present

## 2022-12-17 DIAGNOSIS — M531 Cervicobrachial syndrome: Secondary | ICD-10-CM | POA: Diagnosis not present

## 2023-01-12 DIAGNOSIS — M9901 Segmental and somatic dysfunction of cervical region: Secondary | ICD-10-CM | POA: Diagnosis not present

## 2023-01-12 DIAGNOSIS — M531 Cervicobrachial syndrome: Secondary | ICD-10-CM | POA: Diagnosis not present

## 2023-01-12 DIAGNOSIS — M9902 Segmental and somatic dysfunction of thoracic region: Secondary | ICD-10-CM | POA: Diagnosis not present

## 2023-01-26 ENCOUNTER — Other Ambulatory Visit: Payer: Self-pay

## 2023-01-26 ENCOUNTER — Other Ambulatory Visit: Payer: BC Managed Care – PPO

## 2023-01-26 DIAGNOSIS — R7401 Elevation of levels of liver transaminase levels: Secondary | ICD-10-CM

## 2023-01-26 DIAGNOSIS — H0288B Meibomian gland dysfunction left eye, upper and lower eyelids: Secondary | ICD-10-CM | POA: Diagnosis not present

## 2023-01-26 DIAGNOSIS — H16223 Keratoconjunctivitis sicca, not specified as Sjogren's, bilateral: Secondary | ICD-10-CM | POA: Diagnosis not present

## 2023-01-26 DIAGNOSIS — H0288A Meibomian gland dysfunction right eye, upper and lower eyelids: Secondary | ICD-10-CM | POA: Diagnosis not present

## 2023-01-26 LAB — HEPATIC FUNCTION PANEL
ALT: 38 U/L — ABNORMAL HIGH (ref 0–35)
AST: 28 U/L (ref 0–37)
Albumin: 4.1 g/dL (ref 3.5–5.2)
Alkaline Phosphatase: 77 U/L (ref 39–117)
Bilirubin, Direct: 0.1 mg/dL (ref 0.0–0.3)
Total Bilirubin: 0.5 mg/dL (ref 0.2–1.2)
Total Protein: 6.8 g/dL (ref 6.0–8.3)

## 2023-01-27 ENCOUNTER — Encounter: Payer: Self-pay | Admitting: Family Medicine

## 2023-01-27 ENCOUNTER — Telehealth (INDEPENDENT_AMBULATORY_CARE_PROVIDER_SITE_OTHER): Payer: BC Managed Care – PPO | Admitting: Family Medicine

## 2023-01-27 DIAGNOSIS — Z6841 Body Mass Index (BMI) 40.0 and over, adult: Secondary | ICD-10-CM | POA: Diagnosis not present

## 2023-01-27 DIAGNOSIS — R7303 Prediabetes: Secondary | ICD-10-CM | POA: Diagnosis not present

## 2023-01-27 DIAGNOSIS — K219 Gastro-esophageal reflux disease without esophagitis: Secondary | ICD-10-CM | POA: Diagnosis not present

## 2023-01-27 MED ORDER — WEGOVY 0.25 MG/0.5ML ~~LOC~~ SOAJ: 0.2500 mg | SUBCUTANEOUS | 1 refills | Status: DC

## 2023-01-27 NOTE — Progress Notes (Unsigned)
Patient ID: Monica Zuniga, female   DOB: 1965-08-18, 57 y.o.   MRN: [id number]   Virtual Visit via Video Note  I connected with Monica Zuniga on 01/27/23 at  5:30 PM EDT by a video enabled telemedicine application and verified that I am speaking with the correct person using two identifiers.  Location patient: home Location provider:work or home office Persons participating in the virtual visit: patient, provider  I discussed the limitations of evaluation and management by telemedicine and the availability of in person appointments. The patient expressed understanding and agreed to proceed.   HPI: Monica Zuniga called to discuss obesity management.  She has longstanding history of obesity and has tried multiple things previously including weight watchers, phentermine, Qsymia, medical supervised plan several years ago, and more recently one-on-one instruction with the nutritionist with limited success.  Her current weight is 272 pounds and height 5 feet 4 inches for BMI over 46.  She has multiple comorbidities including fatty liver, osteoarthritis, GERD, mild hyperlipidemia, and prediabetes.  She has been somewhat limited in certain activities because of her osteoarthritis   ROS: See pertinent positives and negatives per HPI.  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    Past Surgical History:  Procedure Laterality Date   EYE SURGERY     as a baby- surgery for eye problem    JOINT REPLACEMENT     L- replacement- 2011   KNEE ARTHROSCOPY     left knee scope 2005, right 2008   TOTAL KNEE ARTHROPLASTY  06/2012   RIGHT KNEE   TOTAL KNEE ARTHROPLASTY  06/27/2012   Procedure: TOTAL KNEE ARTHROPLASTY;  Surgeon: Nestor Lewandowsky, MD;  Location: [facility];  Service: Orthopedics;  Laterality: Right;   WISDOM TOOTH EXTRACTION     /w sedation    Family History  Problem Relation Age of Onset   Dementia Mother    Cancer Father        melanoma   Diabetes  Paternal Grandmother        type ll   Arthritis Other    Colon cancer Neg Hx     SOCIAL HX: Non-smoker.  She is looking at possible retirement within the next year   Current Outpatient Medications:    azelastine (OPTIVAR) 0.05 % ophthalmic solution, , Disp: , Rfl:    b complex vitamins tablet, Take 1 tablet by mouth daily., Disp: , Rfl:    cyclobenzaprine (FLEXERIL) 5 MG tablet, Take 5 mg by mouth 3 (three) times daily., Disp: , Rfl:    levothyroxine (SYNTHROID) 50 MCG tablet, TAKE 1 TABLET DAILY, Disp: 90 tablet, Rfl: 3   Multiple Vitamin (MULTIVITAMIN WITH MINERALS) TABS, Take 1 tablet by mouth daily., Disp: , Rfl:    Omega-3 Fatty Acids (FISH OIL PO), Take by mouth., Disp: , Rfl:    Probiotic Product (PROBIOTIC DAILY PO), Take 1 tablet by mouth daily., Disp: , Rfl:    Semaglutide-Weight Management (WEGOVY) 0.25 MG/0.5ML SOAJ, Inject 0.25 mg into the skin once a week., Disp: 2 mL, Rfl: 1   Vitamin D, Cholecalciferol, 25 MCG (1000 UT) CAPS, Take 1 capsule by mouth daily., Disp: , Rfl:    XIIDRA 5 % SOLN, Apply 1 drop to eye 2 (two) times daily., Disp: , Rfl:   EXAM:  VITALS per patient if applicable:  GENERAL: alert, oriented, appears well and in no acute distress  HEENT: atraumatic, conjunttiva clear, no obvious abnormalities on inspection of external nose and ears  NECK: normal movements of the head and neck  LUNGS: on inspection no signs of respiratory distress, breathing rate appears normal, no obvious gross SOB, gasping or wheezing  CV: no obvious cyanosis  MS: moves all visible extremities without noticeable abnormality  PSYCH/NEURO: pleasant and cooperative, no obvious depression or anxiety, speech and thought processing grossly intact  ASSESSMENT AND PLAN:  Discussed the following assessment and plan:  Morbid obesity with BMI over 46 with multiple comorbidities including GERD, prediabetes, hyperlipidemia, osteoarthritis, fatty liver.  She has tried multiple plans  as above including weight watchers, phentermine, Qsymia, medically supervised plan, and one-on-one with nutritionist without much success.  -We discussed trial of Wegovy 0.25 mg subcutaneous once weekly.  She has no contraindications.  No history of pancreatitis or family history of thyroid cancer. -If we can get this approved recommend feedback in a month with plan to titrate her Wegovy up to 0.5 mg once weekly. -We did discuss possible side effects including nausea.  She has no history of gastroparesis     I discussed the assessment and treatment plan with the patient. The patient was provided an opportunity to ask questions and all were answered. The patient agreed with the plan and demonstrated an understanding of the instructions.   The patient was advised to call back or seek an in-person evaluation if the symptoms worsen or if the condition fails to improve as anticipated.     Evelena Peat, MD

## 2023-01-28 DIAGNOSIS — R7303 Prediabetes: Secondary | ICD-10-CM | POA: Insufficient documentation

## 2023-02-04 ENCOUNTER — Telehealth: Payer: Self-pay | Admitting: Family Medicine

## 2023-02-04 MED ORDER — WEGOVY 0.25 MG/0.5ML ~~LOC~~ SOAJ: 0.2500 mg | SUBCUTANEOUS | 1 refills | Status: DC

## 2023-02-04 NOTE — Telephone Encounter (Signed)
Pt called and stated that the Semaglutide-Weight Management (WEGOVY) 0.25 MG/0.5ML SOAJ that was sent to [facility] is not in network with her INS and would like the Rx to be sent to   [id number] - [place], [place] - [address] Phone: [phone]  Fax: [phone]     Please advise.

## 2023-02-04 NOTE — Telephone Encounter (Signed)
Rx sent 

## 2023-02-05 NOTE — Telephone Encounter (Signed)
Pt called and stated that [facility] as [facility] and would need a PA. [facility] but pt wanted you all to be aware.

## 2023-02-05 NOTE — Telephone Encounter (Signed)
PA initiated and sent to plan Key: [id number]. Medication has been approved by insurance. Patient aware

## 2023-02-09 DIAGNOSIS — M9901 Segmental and somatic dysfunction of cervical region: Secondary | ICD-10-CM | POA: Diagnosis not present

## 2023-02-09 DIAGNOSIS — M9902 Segmental and somatic dysfunction of thoracic region: Secondary | ICD-10-CM | POA: Diagnosis not present

## 2023-02-09 DIAGNOSIS — M531 Cervicobrachial syndrome: Secondary | ICD-10-CM | POA: Diagnosis not present

## 2023-02-15 MED ORDER — WEGOVY 0.25 MG/0.5ML ~~LOC~~ SOAJ: 0.2500 mg | SUBCUTANEOUS | 1 refills | Status: DC

## 2023-02-15 MED ORDER — WEGOVY 0.25 MG/0.5ML ~~LOC~~ SOAJ: 0.2500 mg | SUBCUTANEOUS | 0 refills | Status: DC

## 2023-02-15 NOTE — Telephone Encounter (Signed)
Pt called to say [facility] is telling her Rx must be for a 3 month supply.   Please advise.

## 2023-02-15 NOTE — Telephone Encounter (Signed)
Pt is calling and please sent wegovy rx to  [facility] - [place], [place] - [address] Phone: [phone]  Fax: [phone]    They have med in stock

## 2023-02-15 NOTE — Telephone Encounter (Signed)
Rx sent 

## 2023-02-15 NOTE — Addendum Note (Signed)
Addended by: Christy Sartorius on: 02/15/2023 11:38 AM   Modules accepted: Orders

## 2023-02-15 NOTE — Telephone Encounter (Signed)
Rx sent for 90 days

## 2023-02-15 NOTE — Addendum Note (Signed)
Addended by: Christy Sartorius on: 02/15/2023 09:40 AM   Modules accepted: Orders

## 2023-03-16 DIAGNOSIS — M9902 Segmental and somatic dysfunction of thoracic region: Secondary | ICD-10-CM | POA: Diagnosis not present

## 2023-03-16 DIAGNOSIS — M531 Cervicobrachial syndrome: Secondary | ICD-10-CM | POA: Diagnosis not present

## 2023-03-16 DIAGNOSIS — M9901 Segmental and somatic dysfunction of cervical region: Secondary | ICD-10-CM | POA: Diagnosis not present

## 2023-03-23 ENCOUNTER — Telehealth: Payer: Self-pay | Admitting: Family Medicine

## 2023-03-23 NOTE — Telephone Encounter (Signed)
Pt called for two (2) reasons:  1)  Pt would like clarification on the dosages for [facility], and the dates of when she will need to increase each dose.  2) Pt would like to know what vaccines will she need for her trip to [place] next spring, and where can she get them?

## 2023-03-24 ENCOUNTER — Telehealth: Payer: Self-pay | Admitting: Family Medicine

## 2023-03-24 MED ORDER — WEGOVY 0.5 MG/0.5ML ~~LOC~~ SOAJ: 0.5000 mg | SUBCUTANEOUS | 0 refills | Status: DC

## 2023-03-24 NOTE — Telephone Encounter (Signed)
[facility] doesn't do one month prescriptions.  Pt needs 2 mths or more to be called in.  Any questions please contact patient.

## 2023-03-24 NOTE — Telephone Encounter (Signed)
Rx sent for 3 month supply.

## 2023-03-24 NOTE — Telephone Encounter (Signed)
Patient informed of the message below and voiced understanding. RX sent for Wegovy 0.5 mg as patient is tolerating 0.25 well. Patient informed to schedule visit in regards to vaccinations and patient reported she would scheduled virtual visit to discuss

## 2023-03-25 NOTE — Telephone Encounter (Signed)
Pt says the pharmacy does not have Semaglutide-Weight Management (WEGOVY) 0.5 MG/0.5ML SOAJ . Asking if she can stick with the 0.25 because she is getting good results. Insurance says she would need a 2 month supply for them to fill

## 2023-03-26 MED ORDER — WEGOVY 0.25 MG/0.5ML ~~LOC~~ SOAJ: 0.2500 mg | SUBCUTANEOUS | 0 refills | Status: DC

## 2023-03-26 NOTE — Telephone Encounter (Signed)
Rx sent 

## 2023-03-26 NOTE — Addendum Note (Signed)
Addended by: Christy Sartorius on: 03/26/2023 09:07 AM   Modules accepted: Orders

## 2023-03-30 ENCOUNTER — Telehealth: Payer: Self-pay | Admitting: Family Medicine

## 2023-03-30 NOTE — Telephone Encounter (Signed)
Pt is calling and need PA for Semaglutide-Weight Management (WEGOVY) 0.25 MG/0.5ML SOAJ

## 2023-04-01 ENCOUNTER — Other Ambulatory Visit (HOSPITAL_COMMUNITY): Payer: Self-pay

## 2023-04-01 NOTE — Telephone Encounter (Signed)
Pharmacy Patient Advocate Encounter   Received notification from Pt Calls Messages that prior authorization for [facility] 0.25MG  is required/requested.   Insurance verification completed.   The patient is insured through [employer] .   Per test claim: DENIED.  Full denial letter will be uploaded to the media tab. See denial reason below.   Called insurance to initiate another PA for plan limitation override. Per representative, Insurance only covers 4 mls (2 fills) of the lower doses per year. Insurance will only cover additional quantity of the lower doses if patient misses 2 consecutive doses and restarting treatment. Previous PA is still valid. Patient will have to find the next dose at another pharmacy.  Case ID#: [id number]

## 2023-04-02 NOTE — Telephone Encounter (Signed)
Patient informed and stated that she contacted her insurance and they do have 0.5 mg in stock and will be filling for her.

## 2023-04-13 DIAGNOSIS — M531 Cervicobrachial syndrome: Secondary | ICD-10-CM | POA: Diagnosis not present

## 2023-04-13 DIAGNOSIS — M9901 Segmental and somatic dysfunction of cervical region: Secondary | ICD-10-CM | POA: Diagnosis not present

## 2023-04-13 DIAGNOSIS — M9902 Segmental and somatic dysfunction of thoracic region: Secondary | ICD-10-CM | POA: Diagnosis not present

## 2023-04-19 ENCOUNTER — Other Ambulatory Visit: Payer: BC Managed Care – PPO

## 2023-05-25 DIAGNOSIS — M9901 Segmental and somatic dysfunction of cervical region: Secondary | ICD-10-CM | POA: Diagnosis not present

## 2023-05-25 DIAGNOSIS — M9902 Segmental and somatic dysfunction of thoracic region: Secondary | ICD-10-CM | POA: Diagnosis not present

## 2023-05-25 DIAGNOSIS — M531 Cervicobrachial syndrome: Secondary | ICD-10-CM | POA: Diagnosis not present

## 2023-05-27 ENCOUNTER — Telehealth: Payer: Self-pay | Admitting: Family Medicine

## 2023-05-27 NOTE — Telephone Encounter (Signed)
Pt is calling and would like to go to next mg of   Disp Refills Start End   Semaglutide-Weight Management [facility])        [facility] - [place], [place] - [address] Phone: [phone]  Fax: [phone]

## 2023-05-28 MED ORDER — WEGOVY 1 MG/0.5ML ~~LOC~~ SOAJ: 1.0000 mg | SUBCUTANEOUS | 1 refills | Status: DC

## 2023-05-28 NOTE — Telephone Encounter (Signed)
Patient confirmed she is taking 0.5 mg and 1 mg has been sent

## 2023-05-31 MED ORDER — WEGOVY 1 MG/0.5ML ~~LOC~~ SOAJ: 1.0000 mg | SUBCUTANEOUS | 0 refills | Status: DC

## 2023-05-31 NOTE — Telephone Encounter (Signed)
Rx sent 

## 2023-05-31 NOTE — Telephone Encounter (Signed)
Pt says only 1 month was sent sent in requesting 2 months of 1 ml

## 2023-05-31 NOTE — Addendum Note (Signed)
Addended by: Christy Sartorius on: 05/31/2023 10:44 AM   Modules accepted: Orders

## 2023-06-01 MED ORDER — WEGOVY 1.7 MG/0.75ML ~~LOC~~ SOAJ: 1.7000 mg | SUBCUTANEOUS | 1 refills | Status: DC

## 2023-06-01 MED ORDER — WEGOVY 1.7 MG/0.75ML ~~LOC~~ SOAJ: 1.7000 mg | SUBCUTANEOUS | 0 refills | Status: DC

## 2023-06-01 NOTE — Addendum Note (Signed)
Addended by: Christy Sartorius on: 06/01/2023 02:27 PM   Modules accepted: Orders

## 2023-06-01 NOTE — Telephone Encounter (Signed)
Rx sent and patient aware 

## 2023-06-01 NOTE — Addendum Note (Signed)
Addended by: Christy Sartorius on: 06/01/2023 02:30 PM   Modules accepted: Orders

## 2023-06-01 NOTE — Addendum Note (Signed)
Addended by: Christy Sartorius on: 06/01/2023 04:02 PM   Modules accepted: Orders

## 2023-06-01 NOTE — Telephone Encounter (Signed)
Rx sent 

## 2023-06-01 NOTE — Telephone Encounter (Signed)
Pt says with the mail order program it must be sent in 2 month increments. Pls resend.

## 2023-06-01 NOTE — Telephone Encounter (Signed)
Pt called to say that, as per [facility], the Semaglutide-Weight Management (WEGOVY) 1 MG/0.5ML SOAJ Is on back order and they do not know when they will have it again.  Pt says they have given her 2 options: 1) Ask MD to send Rx for the .5 mg for 2 months 2) Ask MD to send Rx for 1.7 mg for 2-3 months  Pt states she has been tolerating Rx very well, and would not mind increasing dosage to 1.7mg .  Please advise.   [facility] - [place], [place] - [address] Phone: [phone]  Fax: [phone]

## 2023-06-07 ENCOUNTER — Encounter: Payer: Self-pay | Admitting: Family Medicine

## 2023-06-10 ENCOUNTER — Encounter: Payer: Self-pay | Admitting: Podiatry

## 2023-06-10 ENCOUNTER — Other Ambulatory Visit: Payer: Self-pay | Admitting: Podiatry

## 2023-06-10 ENCOUNTER — Ambulatory Visit (INDEPENDENT_AMBULATORY_CARE_PROVIDER_SITE_OTHER): Payer: BC Managed Care – PPO | Admitting: Podiatry

## 2023-06-10 DIAGNOSIS — M19071 Primary osteoarthritis, right ankle and foot: Secondary | ICD-10-CM | POA: Diagnosis not present

## 2023-06-10 MED ORDER — MELOXICAM 7.5 MG PO TABS: 7.5000 mg | ORAL_TABLET | Freq: Every day | ORAL | 2 refills | Status: DC

## 2023-06-10 NOTE — Progress Notes (Signed)
Monica Zuniga presents today for follow-up of her osteoarthritis of her right foot.  She says I think the arthritis has gotten worse she states that him having quite a bit of pain.  I take the meloxicam as needed but not regularly.  Also I got scanned for new orthotics in September and I have not received them yet.  Objective: Vital signs are stable she is alert and oriented x 3 her osteoarthritis and capsulitis of the dorsal aspect of her foot right appears to be doing pretty well actually that is not nearly as significant and at the level of pain as it was previously.  I think today she does have more pain on palpation of her sinus tarsi and end range of motion of the subtalar joint and she does dorsally.  Ankle joint does not demonstrate any kinds of pain at all.  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  Plan: I recommended that she start taking her 7.5 mg of meloxicam on a daily basis.  I also checked with the orthotic lab and they said that the orthotic should be here within the next 2 to 3 weeks.

## 2023-07-12 ENCOUNTER — Ambulatory Visit (INDEPENDENT_AMBULATORY_CARE_PROVIDER_SITE_OTHER): Payer: BC Managed Care – PPO | Admitting: Family Medicine

## 2023-07-12 ENCOUNTER — Encounter: Payer: Self-pay | Admitting: Family Medicine

## 2023-07-12 VITALS — BP 130/80 | HR 75 | Temp 97.8°F | Ht 64.17 in | Wt 253.7 lb

## 2023-07-12 DIAGNOSIS — Z23 Encounter for immunization: Secondary | ICD-10-CM

## 2023-07-12 DIAGNOSIS — E039 Hypothyroidism, unspecified: Secondary | ICD-10-CM | POA: Diagnosis not present

## 2023-07-12 DIAGNOSIS — Z0000 Encounter for general adult medical examination without abnormal findings: Secondary | ICD-10-CM | POA: Diagnosis not present

## 2023-07-12 LAB — LIPID PANEL
Cholesterol: 183 mg/dL (ref 0–200)
HDL: 63 mg/dL (ref >39.00)
LDL Cholesterol: 106 mg/dL — ABNORMAL HIGH (ref 0–99)
NonHDL: 119.79
Total CHOL/HDL Ratio: 3
Triglycerides: 71 mg/dL (ref 0.0–149.0)
VLDL: 14.2 mg/dL (ref 0.0–40.0)

## 2023-07-12 LAB — CBC WITH DIFFERENTIAL/PLATELET
Basophils Absolute: 0 10*3/uL (ref 0.0–0.1)
Basophils Relative: 0.5 % (ref 0.0–3.0)
Eosinophils Absolute: 0.2 10*3/uL (ref 0.0–0.7)
Eosinophils Relative: 3.1 % (ref 0.0–5.0)
HCT: 44.8 % (ref 36.0–46.0)
Hemoglobin: 15 g/dL (ref 12.0–15.0)
Lymphocytes Relative: 31.9 % (ref 12.0–46.0)
Lymphs Abs: 2.4 10*3/uL (ref 0.7–4.0)
MCHC: 33.4 g/dL (ref 30.0–36.0)
MCV: 90.1 fL (ref 78.0–100.0)
Monocytes Absolute: 0.6 10*3/uL (ref 0.1–1.0)
Monocytes Relative: 8.2 % (ref 3.0–12.0)
Neutro Abs: 4.3 10*3/uL (ref 1.4–7.7)
Neutrophils Relative %: 56.3 % (ref 43.0–77.0)
Platelets: 221 10*3/uL (ref 150.0–400.0)
RBC: 4.97 Mil/uL (ref 3.87–5.11)
RDW: 14 % (ref 11.5–15.5)
WBC: 7.6 10*3/uL (ref 4.0–10.5)

## 2023-07-12 LAB — HEPATIC FUNCTION PANEL
ALT: 30 U/L (ref 0–35)
AST: 24 U/L (ref 0–37)
Albumin: 4.4 g/dL (ref 3.5–5.2)
Alkaline Phosphatase: 101 U/L (ref 39–117)
Bilirubin, Direct: 0.2 mg/dL (ref 0.0–0.3)
Total Bilirubin: 0.7 mg/dL (ref 0.2–1.2)
Total Protein: 6.8 g/dL (ref 6.0–8.3)

## 2023-07-12 LAB — BASIC METABOLIC PANEL
BUN: 16 mg/dL (ref 6–23)
CO2: 29 meq/L (ref 19–32)
Calcium: 9.3 mg/dL (ref 8.4–10.5)
Chloride: 101 meq/L (ref 96–112)
Creatinine, Ser: 0.73 mg/dL (ref 0.40–1.20)
GFR: 90.56 mL/min (ref >60.00)
Glucose, Bld: 91 mg/dL (ref 70–99)
Potassium: 4.4 meq/L (ref 3.5–5.1)
Sodium: 139 meq/L (ref 135–145)

## 2023-07-12 LAB — TSH: TSH: 2.61 u[IU]/mL (ref 0.35–5.50)

## 2023-07-12 LAB — HEMOGLOBIN A1C: Hgb A1c MFr Bld: 5.5 % (ref 4.6–6.5)

## 2023-07-12 LAB — HM MAMMOGRAPHY

## 2023-07-12 MED ORDER — ACETAZOLAMIDE 125 MG PO TABS: ORAL_TABLET | ORAL | 0 refills | Status: DC

## 2023-07-12 NOTE — Progress Notes (Signed)
Established Patient Office Visit  Subjective   Patient ID: Monica Zuniga, female    DOB: Oct 26, 1965  Age: 57 y.o. MRN: [id number]  Chief Complaint  Patient presents with   Annual Exam    HPI   Greylyn is seen today for physical exam.  She has history of obesity, prediabetes, osteoarthritis involving knees, history of hyperplastic colon polyps, and history of GERD with laryngopharyngeal reflux.  Spends most of her time at their house at [facility] and they have a townhome here.  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  She is looking forward to that.  Anticipates a lot less stress.  She went on Wegovy last year.  She has lost about 23 pounds since August.  Currently on dosage of 1.7 mg subcutaneous weekly.  Has had some issues with availability.   Will be traveling with family to [facility] for skiing in January.  They will be at altitude up to 13,000 feet.  Has taken medications for altitude sickness in the past with acetazolamide and requesting prescription for that.  Health maintenance reviewed:  Health Maintenance  Topic Date Due   Cervical Cancer Screening (HPV/Pap Cotest)  06/09/2014   COVID-19 Vaccine (4 - 2024-25 season) 03/28/2023   HIV Screening  06/22/2037 (Originally 12/20/1979)   MAMMOGRAM  07/07/2024   Colonoscopy  07/14/2025   DTaP/Tdap/Td (3 - Td or Tdap) 01/27/2027   INFLUENZA VACCINE  Completed   Hepatitis C Screening  Completed   Zoster Vaccines- Shingrix  Completed   HPV VACCINES  Aged Out   -She sees GYN for mammograms and Pap smears.  She states her last Pap was December 2023.  Will be seeing them soon. -She does need flu vaccine. -Other vaccines up-to-date -Colonoscopy due 2026  Family history-father is alive age 9 but very frail.  Lives in assisted living place up in [place].  He has history of melanoma.  Mom died age 24 complications of Alzheimer's disease.   She has a sister alive and well.  No family history of premature CAD.  Social history-Works for [employer] with [employer].  As above, plans to scale back in April of this year.  Married with 3 children.  Non-smoker.  Spends most of her time at [facility] now.  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 She has never used smokeless tobacco. She reports current alcohol use of about 4.0 - 6.0 standard drinks of alcohol per week. She reports that she does not use drugs. family history includes Arthritis in an other family member; Cancer in her father; Dementia in her mother; Diabetes in her paternal grandmother. Allergies  Allergen Reactions   Shellfish Allergy Other (See Comments)    Numbness  Review of Systems  Constitutional:  Negative for chills, fever, malaise/fatigue and weight loss.  HENT:  Negative for hearing loss.   Eyes:  Negative for blurred vision and double vision.  Respiratory:  Negative for cough and shortness of breath.   Cardiovascular:  Negative for chest pain, palpitations and leg swelling.  Gastrointestinal:  Negative for abdominal pain, blood in stool, constipation and diarrhea.  Genitourinary:  Negative for dysuria.  Skin:  Negative for rash.  Neurological:  Negative for dizziness, speech change, seizures, loss of consciousness and headaches.  Psychiatric/Behavioral:  Negative for depression.        Objective:     BP 130/80 (BP Location: Left Arm, Patient Position: Sitting, Cuff Size: Large)   Pulse 75   Temp 97.8 F (36.6 C) (Oral)   Ht 5' 4.17" (1.63 m)   Wt 253 lb 11.2 oz (115.1 kg)   LMP 03/28/2015   SpO2 98%   BMI 43.31 kg/m  BP Readings from Last 3 Encounters:  07/12/23 130/80  07/07/22 110/76  07/04/21 134/76   Wt Readings from Last 3 Encounters:  07/12/23 253 lb 11.2 oz (115.1 kg)  01/27/23 272 lb 3.2 oz (123.5 kg)  07/07/22 272 lb 3.2 oz (123.5 kg)      Physical Exam Vitals reviewed.  Constitutional:      General: She is not in acute distress.    Appearance: She is well-developed. She is not ill-appearing.  HENT:     Head: Normocephalic and atraumatic.  Eyes:     Pupils: Pupils are equal, round, and reactive to light.  Neck:     Thyroid: No thyromegaly.  Cardiovascular:     Rate and Rhythm: Normal rate and regular rhythm.     Heart sounds: Normal heart sounds. No murmur heard. Pulmonary:     Effort: No respiratory distress.     Breath sounds: Normal breath sounds. No wheezing or rales.  Abdominal:     General: Bowel sounds are normal. There is no distension.     Palpations: Abdomen is soft. There is no mass.     Tenderness: There is no abdominal tenderness. There is no guarding or rebound.  Musculoskeletal:        General: Normal range of motion.     Cervical back: Normal range of motion and neck supple.     Right lower leg: No edema.     Left lower leg: No edema.  Lymphadenopathy:     Cervical: No cervical adenopathy.  Skin:    Findings: No rash.  Neurological:     Mental Status: She is alert and oriented to person, place, and time.     Cranial Nerves: No cranial nerve deficit.  Psychiatric:        Behavior: Behavior normal.        Thought Content: Thought content normal.        Judgment: Judgment normal.      No results found for any visits on 07/12/23.    The 10-year ASCVD risk score (Arnett DK, et al., 2019) is: 2.3%     Assessment & Plan:   Problem List Items Addressed This Visit   None Visit Diagnoses       Physical exam    -  Primary   Relevant Orders   Basic metabolic panel   Lipid panel   CBC with Differential/Platelet   TSH   Hepatic function panel   Hemoglobin A1c     Hypothyroidism, unspecified type  Relevant Orders   TSH     Need for influenza vaccination       Relevant Orders   Flu vaccine trivalent PF, 6mos and older(Flulaval,Afluria,Fluarix,Fluzone) (Completed)     57 year old female with osteoarthritis of the knees and history of obesity as well as hypothyroidism seen today for physical.  Doing generally well.  She has lost 23 pounds since starting Wegovy.  Currently on 1.7 mg dose.  Continue current dose and we do have 1 further titration step if her weight starts to plateau.  -Flu vaccine given -She plans to continue GYN follow-up for Pap smears and mammogram -Will be due for repeat colonoscopy in 2 years -We agreed to prescription for acetazolamide for altitude sickness prevention 125 mg 1 twice daily starting 24 hours prior to sent and continue until 2 to 3 days after full Ascent or upon descent  No follow-ups on file.    Evelena Peat, MD

## 2023-07-13 ENCOUNTER — Encounter: Payer: Self-pay | Admitting: Family Medicine

## 2023-07-26 ENCOUNTER — Other Ambulatory Visit: Payer: Self-pay | Admitting: Family Medicine

## 2023-07-26 NOTE — Telephone Encounter (Signed)
Copied from CRM [id number]. Topic: Clinical - Medication Refill >> Jul 26, 2023 12:14 PM Maxwell Marion wrote: Most Recent Primary Care Visit:  Provider: Kristian Covey  Department: [facility]  Visit Type: PHYSICAL  Date: 07/12/2023  Medication: Semaglutide-Weight Management (WEGOVY) 1.7 MG  Has the patient contacted their pharmacy?  (Agent: If no, request that the patient contact the pharmacy for the refill. If patient does not wish to contact the pharmacy document the reason why and proceed with request.) (Agent: If yes, when and what did the pharmacy advise?)  Is this the correct pharmacy for this prescription?  If no, delete pharmacy and type the correct one.  This is the patient's preferred pharmacy:  [facility] - [place], [place] - [address] [place] [address] Phone: [phone] Fax: [phone]  [phone] [place], [place] - [address] [place] [address] Phone: [phone] Fax: [phone]  [id number] [address], [place] - [address] [place] [address] Phone: [phone] Fax: [phone]  [facility] [id number] [place], [place] - [address] [place] [address] Phone: [phone] Fax: [phone]   Has the prescription been filled recently?   Is the patient out of the medication?   Has the patient been seen for an appointment in the last year OR does the patient have an upcoming appointment?   Can we respond through MyChart?   Agent: Please be advised that Rx refills may take up to 3 business days. We ask that you follow-up with your pharmacy.

## 2023-07-27 MED ORDER — WEGOVY 1.7 MG/0.75ML ~~LOC~~ SOAJ: 1.7000 mg | SUBCUTANEOUS | 0 refills | Status: DC

## 2023-08-06 ENCOUNTER — Other Ambulatory Visit: Payer: Self-pay | Admitting: Family Medicine

## 2023-08-06 NOTE — Telephone Encounter (Signed)
 Rx done.

## 2023-08-10 ENCOUNTER — Encounter: Payer: Self-pay | Admitting: Family Medicine

## 2023-08-17 ENCOUNTER — Encounter: Payer: Self-pay | Admitting: Family Medicine

## 2023-08-19 MED ORDER — VIVOTIF PO CPDR: 1.0000 | DELAYED_RELEASE_CAPSULE | ORAL | 0 refills | Status: DC

## 2023-08-19 MED ORDER — ONDANSETRON 8 MG PO TBDP: 8.0000 mg | ORAL_TABLET | Freq: Three times a day (TID) | ORAL | 0 refills | Status: AC | PRN

## 2023-08-19 MED ORDER — AZITHROMYCIN 500 MG PO TABS: ORAL_TABLET | ORAL | 0 refills | Status: DC

## 2023-08-19 MED ORDER — ATOVAQUONE-PROGUANIL HCL 250-100 MG PO TABS: ORAL_TABLET | ORAL | 0 refills | Status: DC

## 2023-09-10 ENCOUNTER — Other Ambulatory Visit: Payer: Self-pay | Admitting: Family Medicine

## 2023-09-15 ENCOUNTER — Other Ambulatory Visit: Payer: Self-pay | Admitting: Family Medicine

## 2023-09-17 ENCOUNTER — Other Ambulatory Visit: Payer: Self-pay | Admitting: Family Medicine

## 2023-09-17 NOTE — Telephone Encounter (Signed)
Copied from CRM [id number]. Topic: Clinical - Medication Refill >> Sep 17, 2023  9:57 AM Aletta Edouard wrote: Most Recent Primary Care Visit:  Provider: Kristian Covey  Department: [facility]  Visit Type: PHYSICAL  Date: 07/12/2023  Medication:(WEGOVY) 1.7 MG/0.75ML SOAJ      patient is requesting a 90 day supply   Has the patient contacted their pharmacy? Yes (Agent: If no, request that the patient contact the pharmacy for the refill. If patient does not wish to contact the pharmacy document the reason why and proceed with request.) (Agent: If yes, when and what did the pharmacy advise?)  Is this the correct pharmacy for this prescription? Yes If no, delete pharmacy and type the correct one.  This is the patient's preferred pharmacy:  [facility] - [place], [place] - [address] [place] [address] Phone: [phone] Fax: [phone]   Has the prescription been filled recently? Yes  Is the patient out of the medication? Yes  Has the patient been seen for an appointment in the last year OR does the patient have an upcoming appointment? Yes  Can we respond through MyChart? Yes  Agent: Please be advised that Rx refills may take up to 3 business days. We ask that you follow-up with your pharmacy.

## 2023-09-20 ENCOUNTER — Telehealth: Payer: Self-pay

## 2023-09-20 ENCOUNTER — Other Ambulatory Visit: Payer: BC Managed Care – PPO

## 2023-09-20 MED ORDER — WEGOVY 1.7 MG/0.75ML ~~LOC~~ SOAJ: 1.7000 mg | SUBCUTANEOUS | 0 refills | Status: DC

## 2023-09-20 NOTE — Telephone Encounter (Signed)
 Spoke with patient we ordered orthotics today and set fitting appointment for 3/24 at 3:45 pm

## 2023-09-22 ENCOUNTER — Other Ambulatory Visit (HOSPITAL_COMMUNITY): Payer: Self-pay

## 2023-09-22 ENCOUNTER — Telehealth: Payer: Self-pay

## 2023-09-22 NOTE — Telephone Encounter (Signed)
 Pharmacy Patient Advocate Encounter   Received notification from  [employer] that prior authorization for [facility] 1.7MG /0.75ML auto-injectors is required/requested.   Insurance verification completed.   The patient is insured through [employer] .   Per test claim: PA required; PA submitted to above mentioned insurance via Fax Key/confirmation #/EOC -- Status is pending  *faxed form to [facility] at [phone]

## 2023-09-24 ENCOUNTER — Other Ambulatory Visit (HOSPITAL_COMMUNITY): Payer: Self-pay

## 2023-09-24 NOTE — Telephone Encounter (Signed)
 Pharmacy Patient Advocate Encounter  Received notification from [employer] [facility] that Prior Authorization for Wegovy 0.25MG /0.5ML auto-injectors has been APPROVED from 09/23/23 to 03/22/24. Ran test claim, Copay is $24.99. This test claim was processed through [facility]- copay amounts may vary at other pharmacies due to pharmacy/plan contracts, or as the patient moves through the different stages of their insurance plan.   PA #/Case ID/Reference #: --

## 2023-10-18 ENCOUNTER — Ambulatory Visit (INDEPENDENT_AMBULATORY_CARE_PROVIDER_SITE_OTHER): Payer: BC Managed Care – PPO

## 2023-10-18 DIAGNOSIS — M19071 Primary osteoarthritis, right ankle and foot: Secondary | ICD-10-CM | POA: Diagnosis not present

## 2023-10-18 DIAGNOSIS — M778 Other enthesopathies, not elsewhere classified: Secondary | ICD-10-CM

## 2023-10-18 DIAGNOSIS — M7752 Other enthesopathy of left foot: Secondary | ICD-10-CM

## 2023-10-18 DIAGNOSIS — M7751 Other enthesopathy of right foot: Secondary | ICD-10-CM | POA: Diagnosis not present

## 2023-10-19 NOTE — Progress Notes (Signed)
 Patient presents today to pick up custom molded foot orthotics, diagnosed with OA of right foot  by Dr. Al Corpus .   Orthotics were dispensed and fit was satisfactory. Reviewed instructions for break-in and wear. Written instructions given to patient.  Patient will follow up as needed.   Addison Bailey Cped, CFo, CFm

## 2023-11-15 ENCOUNTER — Other Ambulatory Visit: Payer: Self-pay | Admitting: Family Medicine

## 2023-11-15 NOTE — Telephone Encounter (Signed)
 Copied from CRM [id number]. Topic: Clinical - Medication Refill >> Nov 15, 2023 10:32 AM Freya Jesus wrote: Most Recent Primary Care Visit:  Provider: Marquetta Sit  Department: [facility]  Visit Type: PHYSICAL  Date: 07/12/2023  Medication: Semaglutide -Weight Management (WEGOVY ) 1.7 MG/0.75ML SOAJ [[id number]]  Has the patient contacted their pharmacy? No (Agent: If no, request that the patient contact the pharmacy for the refill. If patient does not wish to contact the pharmacy document the reason why and proceed with request.) (Agent: If yes, when and what did the pharmacy advise?)  Is this the correct pharmacy for this prescription? Yes If no, delete pharmacy and type the correct one.  This is the patient's preferred pharmacy:  [facility] - [place], [place] - [address] [place] [address] Phone: [phone] Fax: [phone]    Has the prescription been filled recently? No  Is the patient out of the medication? No, has a trip in a few weeks and need them prior.  Has the patient been seen for an appointment in the last year OR does the patient have an upcoming appointment? Yes  Can we respond through MyChart? No  Agent: Please be advised that Rx refills may take up to 3 business days. We ask that you follow-up with your pharmacy.

## 2023-11-17 MED ORDER — WEGOVY 1.7 MG/0.75ML ~~LOC~~ SOAJ: 1.7000 mg | SUBCUTANEOUS | 0 refills | Status: DC

## 2024-01-24 ENCOUNTER — Encounter: Payer: Self-pay | Admitting: Family Medicine

## 2024-01-24 ENCOUNTER — Other Ambulatory Visit: Payer: Self-pay | Admitting: Family Medicine

## 2024-01-24 MED ORDER — WEGOVY 1.7 MG/0.75ML ~~LOC~~ SOAJ: 1.7000 mg | SUBCUTANEOUS | 0 refills | Status: DC

## 2024-02-21 ENCOUNTER — Telehealth: Payer: Self-pay

## 2024-02-21 NOTE — Telephone Encounter (Signed)
 Pt called checking on the status of her refurbished orthotics

## 2024-03-13 ENCOUNTER — Other Ambulatory Visit: Payer: Self-pay | Admitting: Family Medicine

## 2024-03-17 ENCOUNTER — Encounter: Payer: Self-pay | Admitting: Family Medicine

## 2024-03-17 MED ORDER — WEGOVY 2.4 MG/0.75ML ~~LOC~~ SOAJ: 2.4000 mg | SUBCUTANEOUS | 0 refills | Status: DC

## 2024-05-18 ENCOUNTER — Other Ambulatory Visit: Payer: Self-pay | Admitting: Podiatry

## 2024-05-18 ENCOUNTER — Encounter: Payer: Self-pay | Admitting: Family Medicine

## 2024-05-19 ENCOUNTER — Other Ambulatory Visit: Payer: Self-pay | Admitting: Family Medicine

## 2024-05-19 ENCOUNTER — Other Ambulatory Visit (HOSPITAL_COMMUNITY): Payer: Self-pay

## 2024-05-19 ENCOUNTER — Telehealth: Payer: Self-pay

## 2024-05-19 MED ORDER — WEGOVY 2.4 MG/0.75ML ~~LOC~~ SOAJ: 2.4000 mg | SUBCUTANEOUS | 0 refills | Status: DC

## 2024-05-19 NOTE — Telephone Encounter (Signed)
 Noted

## 2024-05-19 NOTE — Telephone Encounter (Signed)
 Pharmacy Patient Advocate Encounter   Received notification from RX Request Messages that prior authorization for Wegovy  2.4 is required/requested.   Insurance verification completed.   The patient is insured through [employer].  Patient has plan limitations, original rx written for 56 day supply, patient plan will only cover 28 day supply.  Per test claim: The current 28 day co-pay is, $24.99.  No PA needed at this time. This test claim was processed through [facility]- copay amounts may vary at other pharmacies due to pharmacy/plan contracts, or as the patient moves through the different stages of their insurance plan.     Patient has current approved PA that expires 09/21/24

## 2024-05-19 NOTE — Telephone Encounter (Signed)
Rx sent for 28 days

## 2024-07-12 ENCOUNTER — Ambulatory Visit: Admitting: Family Medicine

## 2024-07-12 ENCOUNTER — Ambulatory Visit: Payer: Self-pay | Admitting: Family Medicine

## 2024-07-12 ENCOUNTER — Encounter: Payer: Self-pay | Admitting: Family Medicine

## 2024-07-12 VITALS — BP 130/74 | HR 68 | Temp 98.0°F | Ht 64.17 in | Wt 239.9 lb

## 2024-07-12 DIAGNOSIS — Z0000 Encounter for general adult medical examination without abnormal findings: Secondary | ICD-10-CM | POA: Diagnosis not present

## 2024-07-12 DIAGNOSIS — E039 Hypothyroidism, unspecified: Secondary | ICD-10-CM | POA: Insufficient documentation

## 2024-07-12 DIAGNOSIS — Z23 Encounter for immunization: Secondary | ICD-10-CM

## 2024-07-12 LAB — BASIC METABOLIC PANEL WITH GFR
BUN: 14 mg/dL (ref 6–23)
CO2: 29 meq/L (ref 19–32)
Calcium: 9.3 mg/dL (ref 8.4–10.5)
Chloride: 102 meq/L (ref 96–112)
Creatinine, Ser: 0.75 mg/dL (ref 0.40–1.20)
GFR: 87.06 mL/min (ref >60.00)
Glucose, Bld: 95 mg/dL (ref 70–99)
Potassium: 4.6 meq/L (ref 3.5–5.1)
Sodium: 138 meq/L (ref 135–145)

## 2024-07-12 LAB — CBC WITH DIFFERENTIAL/PLATELET
Basophils Absolute: 0 K/uL (ref 0.0–0.1)
Basophils Relative: 0.6 % (ref 0.0–3.0)
Eosinophils Absolute: 0.2 K/uL (ref 0.0–0.7)
Eosinophils Relative: 3.8 % (ref 0.0–5.0)
HCT: 41.6 % (ref 36.0–46.0)
Hemoglobin: 14.1 g/dL (ref 12.0–15.0)
Lymphocytes Relative: 32 % (ref 12.0–46.0)
Lymphs Abs: 1.8 K/uL (ref 0.7–4.0)
MCHC: 34 g/dL (ref 30.0–36.0)
MCV: 89.8 fl (ref 78.0–100.0)
Monocytes Absolute: 0.7 K/uL (ref 0.1–1.0)
Monocytes Relative: 11.8 % (ref 3.0–12.0)
Neutro Abs: 3 K/uL (ref 1.4–7.7)
Neutrophils Relative %: 51.8 % (ref 43.0–77.0)
Platelets: 180 K/uL (ref 150.0–400.0)
RBC: 4.63 Mil/uL (ref 3.87–5.11)
RDW: 13.7 % (ref 11.5–15.5)
WBC: 5.7 K/uL (ref 4.0–10.5)

## 2024-07-12 LAB — LIPID PANEL
Cholesterol: 182 mg/dL (ref 28–200)
HDL: 70.8 mg/dL (ref >39.00)
LDL Cholesterol: 97 mg/dL (ref 10–99)
NonHDL: 111.24
Total CHOL/HDL Ratio: 3
Triglycerides: 73 mg/dL (ref 10.0–149.0)
VLDL: 14.6 mg/dL (ref 0.0–40.0)

## 2024-07-12 LAB — HEPATIC FUNCTION PANEL
ALT: 24 U/L (ref 3–35)
AST: 26 U/L (ref 5–37)
Albumin: 4.3 g/dL (ref 3.5–5.2)
Alkaline Phosphatase: 72 U/L (ref 39–117)
Bilirubin, Direct: 0.1 mg/dL (ref 0.1–0.3)
Total Bilirubin: 0.4 mg/dL (ref 0.2–1.2)
Total Protein: 6.8 g/dL (ref 6.0–8.3)

## 2024-07-12 LAB — HEMOGLOBIN A1C: Hgb A1c MFr Bld: 5.1 % (ref 4.6–6.5)

## 2024-07-12 LAB — TSH: TSH: 3.93 u[IU]/mL (ref 0.35–5.50)

## 2024-07-12 NOTE — Progress Notes (Signed)
 Established Patient Office Visit  Subjective   Patient ID: Monica Zuniga, female    DOB: 09-15-1965  Age: 58 y.o. MRN: [id number]  Chief Complaint  Patient presents with   Annual Exam    HPI   Monica Zuniga is seen for physical exam.  She has past medical history significant for laryngeal pharyngeal reflux, hypothyroidism, osteoarthritis, prediabetes.  She sees GYN for Pap smears.  She went on Wegovy  over a year ago and has had some steady gradual weight loss.  Is been doing more resistance training recently and thinks that some of her weight loss may have been offset by some muscle gain.  She is still doing consulting work about 5 hours/week but very low stress compared to previous employment.  Health maintenance reviewed:  Health Maintenance  Topic Date Due   Hepatitis B Vaccines 19-59 Average Risk (1 of 3 - 19+ 3-dose series) Never done   Cervical Cancer Screening (HPV/Pap Cotest)  06/09/2014   Pneumococcal Vaccine: 50+ Years (1 of 1 - PCV) Never done   COVID-19 Vaccine (4 - 2025-26 season) 03/27/2024   HIV Screening  06/22/2037 (Originally 12/20/1979)   Mammogram  07/11/2025   Colonoscopy  07/14/2025   DTaP/Tdap/Td (3 - Td or Tdap) 01/27/2027   Influenza Vaccine  Completed   Hepatitis C Screening  Completed   Zoster Vaccines- Shingrix   Completed   HPV VACCINES  Aged Out   Meningococcal B Vaccine  Aged Out   - No history of pneumonia vaccine and she does consent to getting that today.  Other vaccines up-to-date.  She got flu vaccine today. -Will be due for colonoscopy repeat 12/26  Family history-father still alive age 65.  Lives in [place].  He has history of melanoma.  Mom died age 72 complications of Alzheimer disease.  She has a sister with no active medical problems.  No family history of premature CAD  Social history-mostly retired.  Doing 5 hours/week of consulting work.  She is married with 3 children.  Non-smoker.  Lives at [facility] of the  year  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   Past Surgical History:  Procedure Laterality Date   EYE SURGERY     as a baby- surgery for eye problem    JOINT REPLACEMENT     L- replacement- 2011   KNEE ARTHROSCOPY     left knee scope 2005, right 2008   TOTAL KNEE ARTHROPLASTY  06/2012   RIGHT KNEE   TOTAL KNEE ARTHROPLASTY  06/27/2012   Procedure: TOTAL KNEE ARTHROPLASTY;  Surgeon: Dempsey JINNY Sensor, MD;  Location: [facility] OR;  Service: Orthopedics;  Laterality: Right;   WISDOM TOOTH EXTRACTION     /w sedation    reports that she has never smoked. She has never used smokeless tobacco. She reports current alcohol use of about 4.0 - 6.0 standard drinks of alcohol per week. She reports that she does not use drugs. family history includes Arthritis in an other family member; Cancer in her father; Dementia in her mother; Diabetes in her paternal grandmother. Allergies[1]  Review of Systems  Constitutional:  Negative for chills, fever and malaise/fatigue.  HENT:  Negative for hearing loss.   Eyes:  Negative for blurred vision and double vision.  Respiratory:  Negative for cough and shortness of breath.   Cardiovascular:  Negative for chest pain, palpitations and leg swelling.  Gastrointestinal:  Negative for abdominal pain, blood in stool, constipation and diarrhea.  Genitourinary:  Negative for dysuria.  Skin:  Negative for rash.  Neurological:  Negative for dizziness, speech change, seizures, loss of consciousness and headaches.  Psychiatric/Behavioral:  Negative for depression.       Objective:     BP 130/74   Pulse 68   Temp 98 F (36.7 C) (Oral)   Ht 5' 4.17 (1.63 m)   Wt 239 lb 14.4 oz (108.8 kg)   LMP 03/28/2015   SpO2 95%   BMI 40.96 kg/m  BP Readings from Last 3 Encounters:  07/12/24 130/74  07/12/23 130/80  07/07/22 110/76   Wt Readings from Last 3 Encounters:  07/12/24 239 lb 14.4 oz (108.8 kg)  07/12/23 253 lb 11.2  oz (115.1 kg)  01/27/23 272 lb 3.2 oz (123.5 kg)      Physical Exam Vitals reviewed.  Constitutional:      General: She is not in acute distress.    Appearance: She is well-developed. She is not ill-appearing.  HENT:     Head: Normocephalic and atraumatic.  Eyes:     Pupils: Pupils are equal, round, and reactive to light.  Neck:     Thyroid : No thyromegaly.  Cardiovascular:     Rate and Rhythm: Normal rate and regular rhythm.     Heart sounds: Normal heart sounds. No murmur heard. Pulmonary:     Effort: No respiratory distress.     Breath sounds: Normal breath sounds. No wheezing or rales.  Abdominal:     General: Bowel sounds are normal. There is no distension.     Palpations: Abdomen is soft. There is no mass.     Tenderness: There is no abdominal tenderness. There is no guarding or rebound.  Musculoskeletal:        General: Normal range of motion.     Cervical back: Normal range of motion and neck supple.  Lymphadenopathy:     Cervical: No cervical adenopathy.  Skin:    Findings: No rash.  Neurological:     Mental Status: She is alert and oriented to person, place, and time.     Cranial Nerves: No cranial nerve deficit.  Psychiatric:        Behavior: Behavior normal.        Thought Content: Thought content normal.        Judgment: Judgment normal.      No results found for any visits on 07/12/24.    The 10-year ASCVD risk score (Arnett DK, et al., 2019) is: 2.6%    Assessment & Plan:   Problem List Items Addressed This Visit       Unprioritized   Hypothyroid   Relevant Orders   TSH   Other Visit Diagnoses       Need for influenza vaccination    -  Primary   Relevant Orders   Flu vaccine trivalent PF, 6mos and older(Flulaval,Afluria,Fluarix,Fluzone) (Completed)     Physical exam       Relevant Orders   Basic metabolic panel with GFR   Lipid panel   CBC with Differential/Platelet   Hepatic function panel   Hemoglobin A66c     58 year old  female with chronic medical problems as above.  She has had some steady gradual weight loss with Wegovy .  Has been exercising fairly regularly and doing more resistance training during the past year.  We discussed the following health maintenance items  -Recommend Prevnar 20 and patient consents - Influenza vaccine given - Check labs as above.  Including TSH with  her past history of hypothyroidism - Will need repeat colonoscopy 12/26 - She still sees GYN for Pap smears and mammograms.  These are up-to-date. - She sees dermatologist yearly for skin cancer screening  No follow-ups on file.    Wolm Scarlet, MD     [1]  Allergies Allergen Reactions   Shellfish Allergy Other (See Comments)    Numbness

## 2024-08-01 ENCOUNTER — Encounter: Payer: Self-pay | Admitting: Family Medicine

## 2024-08-01 MED ORDER — WEGOVY 2.4 MG/0.75ML ~~LOC~~ SOAJ: 2.4000 mg | SUBCUTANEOUS | 0 refills | Status: AC

## 2024-08-08 LAB — HM MAMMOGRAPHY
